# Patient Record
Sex: Male | Born: 1980 | Race: White | Hispanic: No | Smoking: Current some day smoker
Health system: Southern US, Community
[De-identification: ages and names within clinical notes are randomized; demographics above are authoritative.]

## PROBLEM LIST (undated history)

## (undated) DIAGNOSIS — R4589 Other symptoms and signs involving emotional state: Secondary | ICD-10-CM

## (undated) DIAGNOSIS — R400 Somnolence: Secondary | ICD-10-CM

## (undated) DIAGNOSIS — R002 Palpitations: Secondary | ICD-10-CM

## (undated) DIAGNOSIS — F419 Anxiety disorder, unspecified: Secondary | ICD-10-CM

## (undated) DIAGNOSIS — K760 Fatty (change of) liver, not elsewhere classified: Secondary | ICD-10-CM

## (undated) DIAGNOSIS — K829 Disease of gallbladder, unspecified: Secondary | ICD-10-CM

## (undated) DIAGNOSIS — R079 Chest pain, unspecified: Secondary | ICD-10-CM

## (undated) DIAGNOSIS — I10 Essential (primary) hypertension: Secondary | ICD-10-CM

## (undated) DIAGNOSIS — K219 Gastro-esophageal reflux disease without esophagitis: Secondary | ICD-10-CM

## (undated) DIAGNOSIS — M109 Gout, unspecified: Secondary | ICD-10-CM

## (undated) DIAGNOSIS — F329 Major depressive disorder, single episode, unspecified: Secondary | ICD-10-CM

## (undated) DIAGNOSIS — R0602 Shortness of breath: Secondary | ICD-10-CM

## (undated) DIAGNOSIS — G4730 Sleep apnea, unspecified: Secondary | ICD-10-CM

## (undated) DIAGNOSIS — F32A Depression, unspecified: Secondary | ICD-10-CM

## (undated) DIAGNOSIS — G932 Benign intracranial hypertension: Secondary | ICD-10-CM

## (undated) HISTORY — DX: Major depressive disorder, single episode, unspecified: F32.9

## (undated) HISTORY — DX: Depression, unspecified: F32.A

## (undated) HISTORY — DX: Gout, unspecified: M10.9

## (undated) HISTORY — DX: Somnolence: R40.0

## (undated) HISTORY — PX: NO PAST SURGERIES: SHX2092

## (undated) HISTORY — DX: Sleep apnea, unspecified: G47.30

## (undated) HISTORY — DX: Essential (primary) hypertension: I10

## (undated) HISTORY — DX: Palpitations: R00.2

## (undated) HISTORY — DX: Chest pain, unspecified: R07.9

## (undated) HISTORY — DX: Anxiety disorder, unspecified: F41.9

## (undated) HISTORY — DX: Other symptoms and signs involving emotional state: R45.89

## (undated) HISTORY — DX: Fatty (change of) liver, not elsewhere classified: K76.0

## (undated) HISTORY — DX: Shortness of breath: R06.02

## (undated) HISTORY — DX: Benign intracranial hypertension: G93.2

## (undated) HISTORY — DX: Disease of gallbladder, unspecified: K82.9

## (undated) HISTORY — DX: Gastro-esophageal reflux disease without esophagitis: K21.9

---

## 2015-04-17 ENCOUNTER — Ambulatory Visit (INDEPENDENT_AMBULATORY_CARE_PROVIDER_SITE_OTHER): Payer: Managed Care, Other (non HMO) | Admitting: Physician Assistant

## 2015-04-17 VITALS — BP 120/84 | HR 75 | Temp 99.0°F | Resp 18 | Ht 69.75 in | Wt 265.0 lb

## 2015-04-17 DIAGNOSIS — J40 Bronchitis, not specified as acute or chronic: Secondary | ICD-10-CM

## 2015-04-17 MED ORDER — IBUPROFEN 600 MG PO TABS: 600.0000 mg | ORAL_TABLET | Freq: Three times a day (TID) | ORAL | Status: DC | PRN

## 2015-04-17 MED ORDER — BENZONATATE 100 MG PO CAPS: 100.0000 mg | ORAL_CAPSULE | Freq: Three times a day (TID) | ORAL | Status: DC | PRN

## 2015-04-17 MED ORDER — HYDROCODONE-HOMATROPINE 5-1.5 MG/5ML PO SYRP
5.0000 mL | ORAL_SOLUTION | Freq: Every day | ORAL | Status: DC
Start: 2015-04-17 — End: 2016-04-24

## 2015-04-17 NOTE — Progress Notes (Signed)
04/18/2015 at 10:46 AM  Ronald George / DOB: 1981-11-18 / MRN: [id number]  The patient  does not have a problem list on file.  SUBJECTIVE  Chief complaint: Sinus Drainage; Cough; and Cough Congestion   Ronald George is a 34 y.o. well appearing male  complaining of URI symptoms that started 10 days ago which has since resolved.  Associated symptoms included congestion, fever, cough and sore throat, and he did note have difficulty breathing, headache and jaw pain.  He complains of lingering non bloody productive cough. He continues to deny SOB and does not report DOE or chest pain. The patient symptoms show no change thus far. Treatments tried thus far include acetaminophen, cough suppressant of choice with fair  relief. He reports sick contacts.   He  has a past medical history of Depression.    Medications reviewed and updated by myself where necessary, and exist elsewhere in the encounter.   Ronald George is allergic to penicillins. He  reports that he has been smoking.  He does not have any smokeless tobacco history on file. He reports that he does not drink alcohol or use illicit drugs. He  has no sexual activity history on file. The patient  has no past surgical history on file.  His family history includes Hypertension in his father and mother.  Review of Systems  Constitutional: Negative for fever and chills.  Eyes: Negative.   Respiratory: Negative for wheezing.   Cardiovascular: Negative for PND.  Gastrointestinal: Negative for heartburn, nausea, vomiting and abdominal pain.  Genitourinary: Negative.   Musculoskeletal: Negative for myalgias.  Skin: Negative for itching and rash.  Neurological: Negative for dizziness and headaches.  Psychiatric/Behavioral: Negative for depression.    OBJECTIVE  His  height is 5' 9.75" (1.772 m) and weight is 265 lb (120.203 kg). His oral temperature is 99 F (37.2 C). His blood pressure is 120/84 and his pulse is 75. His respiration is 18  and oxygen saturation is 99%.  The patient's body mass index is 38.28 kg/(m^2).  Physical Exam  Vitals reviewed. Constitutional: He is oriented to person, place, and time. He appears well-developed and well-nourished. No distress.  HENT:  Right Ear: Hearing, tympanic membrane, external ear and ear canal normal.  Left Ear: Hearing, tympanic membrane, external ear and ear canal normal.  Nose: No mucosal edema or sinus tenderness. Right sinus exhibits no maxillary sinus tenderness and no frontal sinus tenderness. Left sinus exhibits no maxillary sinus tenderness and no frontal sinus tenderness.  Mouth/Throat: Uvula is midline and mucous membranes are normal. Mucous membranes are not pale, not dry and not cyanotic. No oropharyngeal exudate, posterior oropharyngeal edema, posterior oropharyngeal erythema or tonsillar abscesses.  Cardiovascular: Normal rate and regular rhythm.   Respiratory: Effort normal and breath sounds normal. He has no wheezes. He has no rales.  Musculoskeletal: Normal range of motion.  Lymphadenopathy:    He has no cervical adenopathy.  Neurological: He is alert and oriented to person, place, and time.  Skin: Skin is warm and dry. He is not diaphoretic.  Psychiatric: He has a normal mood and affect.    No results found for this or any previous visit (from the past 24 hour(s)).  ASSESSMENT & PLAN  Ronald George was seen today for sinus drainage, cough and cough congestion.  Diagnoses and all orders for this visit:  Bronchitis: Vitals and PE reassuring.  He is well appearing.  Most likely viral sequelae.  Anticipatory guidance provided and will pursue symptomatic therapy for  the next week.  Advised that if he develops SOB, diaphoresis, DOE or nausea to RTC.  Advised that is he is not improved with the below plan to RTC.  Patient voiced understanding.   Orders: -     ibuprofen (ADVIL,MOTRIN) 600 MG tablet; Take 1 tablet (600 mg total) by mouth every 8 (eight) hours as needed. -      HYDROcodone-homatropine (HYCODAN) 5-1.5 MG/5ML syrup; Take 5 mLs by mouth at bedtime. -     benzonatate (TESSALON) 100 MG capsule; Take 1-2 capsules (100-200 mg total) by mouth 3 (three) times daily as needed for cough.   The patient was advised to call or come back to clinic if he does not see an improvement in symptoms, or worsens with the above plan.   Deliah Boston, MHS, PA-C [facility] 04/18/2015 10:46 AM

## 2016-04-24 ENCOUNTER — Encounter (HOSPITAL_COMMUNITY): Payer: Self-pay | Admitting: Emergency Medicine

## 2016-04-24 ENCOUNTER — Emergency Department (HOSPITAL_COMMUNITY): Payer: Managed Care, Other (non HMO)

## 2016-04-24 ENCOUNTER — Emergency Department (HOSPITAL_COMMUNITY)
Admission: EM | Admit: 2016-04-24 | Discharge: 2016-04-24 | Disposition: A | Discharge status: Home / Self Care | Payer: Managed Care, Other (non HMO) | Attending: Emergency Medicine | Admitting: Emergency Medicine

## 2016-04-24 DIAGNOSIS — K8020 Calculus of gallbladder without cholecystitis without obstruction: Secondary | ICD-10-CM | POA: Diagnosis not present

## 2016-04-24 DIAGNOSIS — F17200 Nicotine dependence, unspecified, uncomplicated: Secondary | ICD-10-CM | POA: Diagnosis not present

## 2016-04-24 DIAGNOSIS — F329 Major depressive disorder, single episode, unspecified: Secondary | ICD-10-CM | POA: Insufficient documentation

## 2016-04-24 DIAGNOSIS — R748 Abnormal levels of other serum enzymes: Secondary | ICD-10-CM | POA: Insufficient documentation

## 2016-04-24 DIAGNOSIS — Z79899 Other long term (current) drug therapy: Secondary | ICD-10-CM | POA: Insufficient documentation

## 2016-04-24 DIAGNOSIS — R1010 Upper abdominal pain, unspecified: Secondary | ICD-10-CM | POA: Diagnosis present

## 2016-04-24 LAB — COMPREHENSIVE METABOLIC PANEL
ALK PHOS: 66 U/L (ref 38–126)
ALT: 47 U/L (ref 17–63)
AST: 31 U/L (ref 15–41)
Albumin: 4.1 g/dL (ref 3.5–5.0)
Anion gap: 10 (ref 5–15)
BILIRUBIN TOTAL: 0.7 mg/dL (ref 0.3–1.2)
BUN: 12 mg/dL (ref 6–20)
CALCIUM: 9.2 mg/dL (ref 8.9–10.3)
CO2: 22 mmol/L (ref 22–32)
CREATININE: 1.14 mg/dL (ref 0.61–1.24)
Chloride: 109 mmol/L (ref 101–111)
Glucose, Bld: 93 mg/dL (ref 65–99)
Potassium: 3.8 mmol/L (ref 3.5–5.1)
Sodium: 141 mmol/L (ref 135–145)
TOTAL PROTEIN: 7.1 g/dL (ref 6.5–8.1)

## 2016-04-24 LAB — CBC
HCT: 43.9 % (ref 39.0–52.0)
Hemoglobin: 15.7 g/dL (ref 13.0–17.0)
MCH: 28.8 pg (ref 26.0–34.0)
MCHC: 35.8 g/dL (ref 30.0–36.0)
MCV: 80.6 fL (ref 78.0–100.0)
PLATELETS: 248 10*3/uL (ref 150–400)
RBC: 5.45 MIL/uL (ref 4.22–5.81)
RDW: 13.3 % (ref 11.5–15.5)
WBC: 8.7 10*3/uL (ref 4.0–10.5)

## 2016-04-24 LAB — LIPASE, BLOOD: LIPASE: 55 U/L — AB (ref 11–51)

## 2016-04-24 MED ORDER — ONDANSETRON 8 MG PO TBDP: 8.0000 mg | ORAL_TABLET | Freq: Three times a day (TID) | ORAL | Status: DC | PRN

## 2016-04-24 NOTE — Discharge Instructions (Signed)
Results here show that you have gallstones. Lipase is elevated, likely due to gall stones.  See the Surgeon a requested. Return to the Er if the pain is severe.   Cholelithiasis Cholelithiasis (also called gallstones) is a form of gallbladder disease in which gallstones form in your gallbladder. The gallbladder is an organ that stores bile made in the liver, which helps digest fats. Gallstones begin as small crystals and slowly grow into stones. Gallstone pain occurs when the gallbladder spasms and a gallstone is blocking the duct. Pain can also occur when a stone passes out of the duct.  RISK FACTORS  Being male.   Having multiple pregnancies. Health care providers sometimes advise removing diseased gallbladders before future pregnancies.   Being obese.  Eating a diet heavy in fried foods and fat.   Being older than 60 years and increasing age.   Prolonged use of medicines containing male hormones.   Having diabetes mellitus.   Rapidly losing weight.   Having a family history of gallstones (heredity).  SYMPTOMS  Nausea.   Vomiting.  Abdominal pain.   Yellowing of the skin (jaundice).   Sudden pain. It may persist from several minutes to several hours.  Fever.   Tenderness to the touch. In some cases, when gallstones do not move into the bile duct, people have no pain or symptoms. These are called "silent" gallstones.  TREATMENT Silent gallstones do not need treatment. In severe cases, emergency surgery may be required. Options for treatment include:  Surgery to remove the gallbladder. This is the most common treatment.  Medicines. These do not always work and may take 6-12 months or more to work.  Shock wave treatment (extracorporeal biliary lithotripsy). In this treatment an ultrasound machine sends shock waves to the gallbladder to break gallstones into smaller pieces that can pass into the intestines or be dissolved by medicine. HOME CARE  INSTRUCTIONS   Only take over-the-counter or prescription medicines for pain, discomfort, or fever as directed by your health care provider.   Follow a low-fat diet until seen again by your health care provider. Fat causes the gallbladder to contract, which can result in pain.   Follow up with your health care provider as directed. Attacks are almost always recurrent and surgery is usually required for permanent treatment.  SEEK IMMEDIATE MEDICAL CARE IF:   Your pain increases and is not controlled by medicines.   You have a fever or persistent symptoms for more than 2-3 days.   You have a fever and your symptoms suddenly get worse.   You have persistent nausea and vomiting.  MAKE SURE YOU:   Understand these instructions.  Will watch your condition.  Will get help right away if you are not doing well or get worse.   This information is not intended to replace advice given to you by your health care provider. Make sure you discuss any questions you have with your health care provider.   Document Released: 12/11/2005 Document Revised: 08/17/2013 Document Reviewed: 06/08/2013 Elsevier Interactive Patient Education 2016 Elsevier Inc. Clear Liquid Diet A clear liquid diet is a short-term diet that is prescribed to provide the necessary fluid and basic energy you need when you can have nothing else. The clear liquid diet consists of liquids or solids that will become liquid at room temperature. You should be able to see through the liquid. There are many reasons that you may be restricted to clear liquids, such as:  When you have a sudden-onset (acute) condition  that occurs before or after surgery.  To help your body slowly get adjusted to food again after a long period when you were unable to have food.  Replacement of fluids when you have a diarrheal disease.  When you are going to have certain exams, such as a colonoscopy, in which instruments are inserted inside your body  to look at parts of your digestive system. WHAT CAN I HAVE? A clear liquid diet does not provide all the nutrients you need. It is important to choose a variety of the following items to get as many nutrients as possible:  Vegetable juices that do not have pulp.  Fruit juices and fruit drinks that do not have pulp.  Coffee (regular or decaffeinated), tea, or soda at the discretion of your health care provider.  Clear bouillon, broth, or strained broth-based soups.  High-protein and flavored gelatins.  Sugar or honey.  Ices or frozen ice pops that do not contain milk. If you are not sure whether you can have certain items, you should ask your health care provider. You may also ask your health care provider if there are any other clear liquid options.   This information is not intended to replace advice given to you by your health care provider. Make sure you discuss any questions you have with your health care provider.   Document Released: 12/15/2005 Document Revised: 12/20/2013 Document Reviewed: 11/11/2013 Elsevier Interactive Patient Education [employer] Elsevier Inc.

## 2016-04-24 NOTE — ED Provider Notes (Signed)
CSN: [id number]     Arrival date & time 04/24/16  0251 History  By signing my name below, I, Bethel Born, attest that this documentation has been prepared under the direction and in the presence of Derwood Kaplan, MD. Electronically Signed: Bethel Born, ED Scribe. 04/24/2016. 5:27 AM   No chief complaint on file.  The history is provided by the patient. No language interpreter was used.   Ronald George is a 35 y.o. male who presents to the Emergency Department complaining of intermittent, sharp/cramping, 7/10 in severity, mid and upper abdominal pain with onset late yesterday afternoon while at work. He at a salad for lunch that did not have heavy cheese. The pain is exacerbated by deep breathing and radiates to the back. This pain is different than pain that he has with GERD and did not improve after an antacid. He ate a steak quesidilla from [employer] for dinner and notes that the pain became unbearable just PTA. Associated symptoms include nausea and 3 episodes of emesis yesterday. He is no longer nauseous. Pt denies diarrhea.  Pt denies heavy ibuprofen or alcohol use. He has no history of abdominal ulcers.  No history of abdominal surgery.   Past Medical History  Diagnosis Date  . Depression    History reviewed. No pertinent past surgical history. Family History  Problem Relation Age of Onset  . Hypertension Mother   . Hypertension Father    Social History  Substance Use Topics  . Smoking status: Light Tobacco Smoker  . Smokeless tobacco: None  . Alcohol Use: No    Review of Systems  10 Systems reviewed and all are negative for acute change except as noted in the HPI.  Allergies  Penicillins  Home Medications   Prior to Admission medications   Medication Sig Start Date End Date Taking? Authorizing Provider  cholecalciferol (VITAMIN D) 1000 units tablet Take 1,000 Units by mouth daily.   Yes Historical Provider, MD  LORazepam (ATIVAN) 0.5 MG tablet Take 0.5 mg by  mouth daily. 04/10/16  Yes Historical Provider, MD  Thiamine HCl (VITAMIN B-1) 250 MG tablet Take 250 mg by mouth daily.   Yes Historical Provider, MD  ondansetron (ZOFRAN ODT) 8 MG disintegrating tablet Take 1 tablet (8 mg total) by mouth every 8 (eight) hours as needed for nausea. 04/24/16   Yen Wandell, MD   BP 144/87 mmHg  Pulse 77  Temp(Src) 98.7 F (37.1 C) (Oral)  Resp 18  SpO2 96% Physical Exam  Constitutional: He is oriented to person, place, and time. He appears well-developed and well-nourished.  HENT:  Head: Normocephalic and atraumatic.  Eyes: EOM are normal.  Neck: Normal range of motion.  Cardiovascular: Normal rate, regular rhythm, normal heart sounds and intact distal pulses.   Pulmonary/Chest: Effort normal and breath sounds normal. No respiratory distress.  Abdominal: Soft. Bowel sounds are normal. He exhibits no distension. There is tenderness. There is no rebound and no guarding.  Generalized abdominal tenderness that is worse in the RUQ  Musculoskeletal: Normal range of motion.  Neurological: He is alert and oriented to person, place, and time.  Skin: Skin is warm and dry.  Psychiatric: He has a normal mood and affect. Judgment normal.  Nursing note and vitals reviewed.   ED Course  Procedures (including critical care time) DIAGNOSTIC STUDIES: Oxygen Saturation is 96% on RA,  normal by my interpretation.    COORDINATION OF CARE: 3:37 AM Discussed treatment plan which includes lab work and abdominal US with  pt at bedside and pt agreed to plan. Pt declines pain medication at the time of initial assessment.   Labs Review Labs Reviewed  LIPASE, BLOOD - Abnormal; Notable for the following:    Lipase 55 (*)    All other components within normal limits  COMPREHENSIVE METABOLIC PANEL  CBC  URINALYSIS, ROUTINE W REFLEX MICROSCOPIC (NOT AT [facility])    Imaging Review [place] Abdomen Limited  04/24/2016  CLINICAL DATA:  Right upper quadrant pain for 1 day EXAM: US  ABDOMEN LIMITED - RIGHT UPPER QUADRANT COMPARISON:  12/26/2015 FINDINGS: Gallbladder: Layering calculi.  No wall thickening or focal tenderness. Common bile duct: Limited visualization.  4 mm diameter where seen. Liver: Markedly echogenic with poor acoustic transmission, obscuring the mid and deep liver. IMPRESSION: 1. Cholelithiasis without cholecystitis. 2. Markedly steatotic liver, limiting sonographic assessment. Electronically Signed   By: Marnee SpringJonathon  Watts M.D.   On: 04/24/2016 04:36   I personally reviewed and evaluated these images and lab results as a part of my medical decision-making.   EKG Interpretation None      MDM   Final diagnoses:  Elevated lipase  Calculus of gallbladder without cholecystitis without obstruction    I personally performed the services described in this documentation, which was scribed in my presence. The recorded information has been reviewed and is accurate.  Pt comes in with abd pain. Vague discomfort, but clinical suspicion high for cholelithiasis vs. PUD. US ordered, shows gall stones. Pt had severe pain prior to ER arrival with emesis, but the symptoms are better now. He has lipase elevation - likely from gall stones. Pain is tolerable - will d/c. Strict ER return precautions have been discussed, and patient is agreeing with the plan and is comfortable with the workup done and the recommendations from the ER.   Derwood KaplanAnkit Jian Hodgman, MD 04/24/16 [phone]

## 2016-04-24 NOTE — ED Notes (Signed)
Patient presents for mid upper abdominal pain x1 day, described as cramping, two episodes of emesis. Last BM yesterday, normal for patient. Denies recent cough, fever, lightheadedness, dizziness, Rates pain 7/10.

## 2016-05-06 ENCOUNTER — Other Ambulatory Visit: Payer: Self-pay | Admitting: Surgery

## 2016-06-10 ENCOUNTER — Other Ambulatory Visit: Payer: Self-pay | Admitting: Surgery

## 2016-06-23 ENCOUNTER — Other Ambulatory Visit (HOSPITAL_COMMUNITY): Payer: Managed Care, Other (non HMO)

## 2016-06-24 ENCOUNTER — Encounter (HOSPITAL_COMMUNITY): Payer: Self-pay

## 2016-06-24 ENCOUNTER — Encounter (HOSPITAL_COMMUNITY)
Admission: RE | Admit: 2016-06-24 | Discharge: 2016-06-24 | Disposition: A | Discharge status: Home / Self Care | Payer: Managed Care, Other (non HMO) | Source: Ambulatory Visit | Attending: Surgery | Admitting: Surgery

## 2016-06-24 DIAGNOSIS — Z01812 Encounter for preprocedural laboratory examination: Secondary | ICD-10-CM | POA: Diagnosis present

## 2016-06-24 DIAGNOSIS — K8020 Calculus of gallbladder without cholecystitis without obstruction: Secondary | ICD-10-CM | POA: Diagnosis not present

## 2016-06-24 LAB — CBC
HEMATOCRIT: 44.1 % (ref 39.0–52.0)
Hemoglobin: 15 g/dL (ref 13.0–17.0)
MCH: 28.3 pg (ref 26.0–34.0)
MCHC: 34 g/dL (ref 30.0–36.0)
MCV: 83.2 fL (ref 78.0–100.0)
PLATELETS: 234 10*3/uL (ref 150–400)
RBC: 5.3 MIL/uL (ref 4.22–5.81)
RDW: 13.2 % (ref 11.5–15.5)
WBC: 6.8 10*3/uL (ref 4.0–10.5)

## 2016-06-24 NOTE — Pre-Procedure Instructions (Addendum)
Georgina QuintBrandon Goodine  06/24/2016      [facility] [id number] [facility], [place] - [address] [facility] [address] [place] [place] [id number] Phone: [phone] Fax: [phone]    Your procedure is scheduled on 06/30/16.  [facility].M[facility]  Call this number if you have problems the morning of surgery:  [phone]   Remember:  Do not eat food or drink liquids after midnight.  Take these medicines the morning of surgery with A SIP OF WATER omeprazole(prilosec), lorazepam(ativan) if needed  STOP all herbel meds, nsaids (aleve,naproxen,advil,ibuprofen) 5 days prior to surgery starting tomorrow including vitamins, aspirin   Do not wear jewelry, make-up or nail polish.  Do not wear lotions, powders, or perfumes.  You may wear deoderant.  Do not shave 48 hours prior to surgery.  Men may shave face and neck.  Do not bring valuables to the hospital.  [facility] is not responsible for any belongings or valuables.  Contacts, dentures or bridgework may not be worn into surgery.  Leave your suitcase in the car.  After surgery it may be brought to your room.  For patients admitted to the hospital, discharge time will be determined by your treatment team.  Patients discharged the day of surgery will not be allowed to drive home.   Name and phone number of your driver:    Special instructions:   Special Instructions: [place] - Preparing for Surgery  Before surgery, you can play an important role.  Because skin is not sterile, your skin needs to be as free of germs as possible.  You can reduce the number of germs on you skin by washing with CHG (chlorahexidine gluconate) soap before surgery.  CHG is an antiseptic cleaner which kills germs and bonds with the skin to continue killing germs even after washing.  Please DO NOT use if you have an allergy to CHG or antibacterial soaps.  If your skin becomes reddened/irritated  stop using the CHG and inform your nurse when you arrive at Short Stay.  Do not shave (including legs and underarms) for at least 48 hours prior to the first CHG shower.  You may shave your face.  Please follow these instructions carefully:   1.  Shower with CHG Soap the night before surgery and the morning of Surgery.  2.  If you choose to wash your hair, wash your hair first as usual with your normal shampoo.  3.  After you shampoo, rinse your hair and body thoroughly to remove the Shampoo.  4.  Use CHG as you would any other liquid soap.  You can apply chg directly  to the skin and wash gently with scrungie or a clean washcloth.  5.  Apply the CHG Soap to your body ONLY FROM THE NECK DOWN.  Do not use on open wounds or open sores.  Avoid contact with your eyes ears, mouth and genitals (private parts).  Wash genitals (private parts)       with your normal soap.  6.  Wash thoroughly, paying special attention to the area where your surgery will be performed.  7.  Thoroughly rinse your body with warm water from the neck down.  8.  DO NOT shower/wash with your normal soap after using and rinsing off the CHG Soap.  9.  Pat yourself dry with a clean towel.  10.  Wear clean pajamas.            11.  Place clean sheets on your bed the night of your first shower and do not sleep with pets.  Day of Surgery  Do not apply any lotions/deodorants the morning of surgery.  Please wear clean clothes to the hospital/surgery center.  Please read over the following fact sheets that you were given. Pain Booklet, Coughing and Deep Breathing and Surgical Site Infection Prevention

## 2016-06-27 MED ORDER — CIPROFLOXACIN IN D5W 400 MG/200ML IV SOLN
400.0000 mg | INTRAVENOUS | Status: AC
  Administered 2016-06-30: 400 mg via INTRAVENOUS
  Filled 2016-06-27: qty 200

## 2016-06-29 NOTE — H&P (Signed)
Ronald HiresBrandon M. George  Location: [facility] Patient #: [id number] DOB: Sep 27, 1981 Divorced / Language: [place] / Race: White Male   History of Present Illness  Patient words: New-gallbladder.  The patient is a 35 year old male who presents for evaluation of gall stones. This is a pleasant gentleman referred by the emergency department after a significant gallbladder attack. He presented there with the sudden onset of right upper quadrant epigastric abdominal pain with nausea and vomiting. The pain was sharp and severe. During the visit to the emergency department he improved. At that time he had an ultrasound showing cholelithiasis. There is no gallbladder wall thickening. The bile duct was normal. His liver function tests were normal but his lipase was elevated at 55. He has had no previous gallbladder attacks. He is otherwise healthy and without complaints. Bowel movements are within normal.   Other Problems Anxiety Disorder Gastroesophageal Reflux Disease  Past Surgical History  No pertinent past surgical history  Diagnostic Studies History  Colonoscopy never  Allergies  Penicillin G Sodium *PENICILLINS*  Medication History ( LORazepam (0.5MG  Tablet, Oral) Active. Omeprazole (20MG  Capsule DR, Oral) Active. Medications Reconciled  Social History  Caffeine use Carbonated beverages, Coffee, Tea. No alcohol use No drug use Tobacco use Current every day smoker.  Family History Arthritis Father, Mother. Breast Cancer Family Members In General. Cancer Family Members In General. Cerebrovascular Accident Family Members In General. Depression Mother. Hypertension Father, Mother.    Review of Systems ( General Present- Fatigue and Weight Gain. Not Present- Appetite Loss, Chills, Fever, Night Sweats and Weight Loss. Skin Not Present- Change in Wart/Mole, Dryness, Hives, Jaundice, New Lesions, Non-Healing Wounds, Rash and Ulcer. HEENT Not  Present- Earache, Hearing Loss, Hoarseness, Nose Bleed, Oral Ulcers, Ringing in the Ears, Seasonal Allergies, Sinus Pain, Sore Throat, Visual Disturbances, Wears glasses/contact lenses and Yellow Eyes. Respiratory Present- Difficulty Breathing. Not Present- Bloody sputum, Chronic Cough, Snoring and Wheezing. Breast Not Present- Breast Mass, Breast Pain, Nipple Discharge and Skin Changes. Cardiovascular Present- Shortness of Breath. Not Present- Chest Pain, Difficulty Breathing Lying Down, Leg Cramps, Palpitations, Rapid Heart Rate and Swelling of Extremities. Gastrointestinal Present- Abdominal Pain, Bloating, Excessive gas and Indigestion. Not Present- Bloody Stool, Change in Bowel Habits, Chronic diarrhea, Constipation, Difficulty Swallowing, Gets full quickly at meals, Hemorrhoids, Nausea, Rectal Pain and Vomiting. Male Genitourinary Not Present- Blood in Urine, Change in Urinary Stream, Frequency, Impotence, Nocturia, Painful Urination, Urgency and Urine Leakage. Musculoskeletal Not Present- Back Pain, Joint Pain, Joint Stiffness, Muscle Pain, Muscle Weakness and Swelling of Extremities. Neurological Not Present- Decreased Memory, Fainting, Headaches, Numbness, Seizures, Tingling, Tremor, Trouble walking and Weakness. Psychiatric Present- Anxiety and Change in Sleep Pattern. Not Present- Bipolar, Depression, Fearful and Frequent crying. Endocrine Not Present- Cold Intolerance, Excessive Hunger, Hair Changes, Heat Intolerance, Hot flashes and New Diabetes. Hematology Not Present- Easy Bruising, Excessive bleeding, Gland problems, HIV and Persistent Infections.  Vitals   Weight: 275.8 lb Height: 70in Body Surface Area: 2.39 m Body Mass Index: 39.57 kg/m  Temp.: 98.80F(Oral)  Pulse: 66 (Regular)  BP: 126/70 (Sitting, Left Arm, Standard)   Physical Exam  General Mental Status-Alert. General Appearance-Consistent with stated age. Hydration-Well  hydrated. Voice-Normal.  Head and Neck Head-normocephalic, atraumatic with no lesions or palpable masses.  Eye Eyeball - Bilateral-Extraocular movements intact. Sclera/Conjunctiva - Bilateral-No scleral icterus.  Chest and Lung Exam Chest and lung exam reveals -quiet, even and easy respiratory effort with no use of accessory muscles and on auscultation, normal breath sounds, no adventitious sounds  and normal vocal resonance. Inspection Chest Wall - Normal. Back - normal.  Cardiovascular Cardiovascular examination reveals -on palpation PMI is normal in location and amplitude, no palpable S3 or S4. Normal cardiac borders., normal heart sounds, regular rate and rhythm with no murmurs, carotid auscultation reveals no bruits and normal pedal pulses bilaterally.  Abdomen Inspection Inspection of the abdomen reveals - No Hernias. Skin - Scar - no surgical scars. Palpation/Percussion Palpation and Percussion of the abdomen reveal - Soft, Non Tender, No Rebound tenderness, No Rigidity (guarding) and No hepatosplenomegaly. Auscultation Auscultation of the abdomen reveals - Bowel sounds normal.  Neurologic - Did not examine.  Musculoskeletal Normal Exam - Left-Upper Extremity Strength Normal and Lower Extremity Strength Normal. Normal Exam - Right-Upper Extremity Strength Normal, Lower Extremity Weakness.    Assessment & Plan  SYMPTOMATIC CHOLELITHIASIS (K80.20)  Impression: I discussed the diagnosis with him in detail. I suspect that he had a temporary bile duct stone given the elevated lipase and the severity of his symptoms. Again, he is doing much better but still has some discomfort. Laparoscopic cholecystectomy with cholangiogram is recommended. I gave him literature regarding the surgery. I discussed the surgical procedure in detail. I discussed the risk of surgery which includes but is not limited to bleeding, infection, bile duct injury, bile leak, injury to other  structures, the need to convert to an open procedure, postoperative recovery, etc. He understands and wished to proceed with surgery. Current Plans Pt Education - Pamphlet Given - Laparoscopic Gallbladder Surgery: discussed with patient and provided information.

## 2016-06-30 ENCOUNTER — Ambulatory Visit (HOSPITAL_COMMUNITY)
Admission: RE | Admit: 2016-06-30 | Discharge: 2016-06-30 | Disposition: A | Discharge status: Home / Self Care | Payer: Managed Care, Other (non HMO) | Source: Ambulatory Visit | Attending: Surgery | Admitting: Surgery

## 2016-06-30 ENCOUNTER — Ambulatory Visit (HOSPITAL_COMMUNITY): Payer: Managed Care, Other (non HMO) | Admitting: Anesthesiology

## 2016-06-30 ENCOUNTER — Ambulatory Visit (HOSPITAL_COMMUNITY): Payer: Managed Care, Other (non HMO)

## 2016-06-30 ENCOUNTER — Encounter (HOSPITAL_COMMUNITY): Payer: Self-pay | Admitting: Urology

## 2016-06-30 ENCOUNTER — Encounter (HOSPITAL_COMMUNITY)
Admission: RE | Disposition: A | Discharge status: Home / Self Care | Payer: Self-pay | Source: Ambulatory Visit | Attending: Surgery

## 2016-06-30 DIAGNOSIS — K8010 Calculus of gallbladder with chronic cholecystitis without obstruction: Secondary | ICD-10-CM | POA: Insufficient documentation

## 2016-06-30 DIAGNOSIS — Z419 Encounter for procedure for purposes other than remedying health state, unspecified: Secondary | ICD-10-CM

## 2016-06-30 DIAGNOSIS — K219 Gastro-esophageal reflux disease without esophagitis: Secondary | ICD-10-CM | POA: Insufficient documentation

## 2016-06-30 HISTORY — PX: CHOLECYSTECTOMY: SHX55

## 2016-06-30 SURGERY — LAPAROSCOPIC CHOLECYSTECTOMY WITH INTRAOPERATIVE CHOLANGIOGRAM
Anesthesia: General

## 2016-06-30 MED ORDER — LIDOCAINE HCL (CARDIAC) 20 MG/ML IV SOLN
INTRAVENOUS | Status: DC | PRN
  Administered 2016-06-30: 60 mg via INTRAVENOUS

## 2016-06-30 MED ORDER — ONDANSETRON HCL 4 MG/2ML IJ SOLN
INTRAMUSCULAR | Status: AC
  Filled 2016-06-30: qty 2

## 2016-06-30 MED ORDER — MEPERIDINE HCL 25 MG/ML IJ SOLN: 6.2500 mg | INTRAMUSCULAR | Status: DC | PRN

## 2016-06-30 MED ORDER — DIPHENHYDRAMINE HCL 50 MG/ML IJ SOLN
INTRAMUSCULAR | Status: AC
  Filled 2016-06-30: qty 1

## 2016-06-30 MED ORDER — SODIUM CHLORIDE 0.9 % IV SOLN
INTRAVENOUS | Status: DC | PRN
  Administered 2016-06-30: 9 mL

## 2016-06-30 MED ORDER — ONDANSETRON HCL 4 MG/2ML IJ SOLN
INTRAMUSCULAR | Status: DC | PRN
  Administered 2016-06-30: 4 mg via INTRAVENOUS

## 2016-06-30 MED ORDER — LIDOCAINE 2% (20 MG/ML) 5 ML SYRINGE
INTRAMUSCULAR | Status: AC
  Filled 2016-06-30: qty 5

## 2016-06-30 MED ORDER — PROPOFOL 10 MG/ML IV BOLUS
INTRAVENOUS | Status: AC
  Filled 2016-06-30: qty 20

## 2016-06-30 MED ORDER — OXYCODONE HCL 5 MG PO TABS: 5.0000 mg | ORAL_TABLET | ORAL | Status: DC | PRN

## 2016-06-30 MED ORDER — SODIUM CHLORIDE 0.9 % IR SOLN
Status: DC | PRN
  Administered 2016-06-30: 1000 mL

## 2016-06-30 MED ORDER — LACTATED RINGERS IV SOLN: INTRAVENOUS | Status: DC

## 2016-06-30 MED ORDER — SODIUM CHLORIDE 0.9% FLUSH: 3.0000 mL | Freq: Two times a day (BID) | INTRAVENOUS | Status: DC

## 2016-06-30 MED ORDER — BUPIVACAINE-EPINEPHRINE (PF) 0.25% -1:200000 IJ SOLN
INTRAMUSCULAR | Status: AC
Start: 2016-06-30 — End: 2016-06-30
  Filled 2016-06-30: qty 30

## 2016-06-30 MED ORDER — SUGAMMADEX SODIUM 500 MG/5ML IV SOLN
INTRAVENOUS | Status: DC | PRN
  Administered 2016-06-30: 360 mg via INTRAVENOUS

## 2016-06-30 MED ORDER — ACETAMINOPHEN 650 MG RE SUPP: 650.0000 mg | RECTAL | Status: DC | PRN

## 2016-06-30 MED ORDER — 0.9 % SODIUM CHLORIDE (POUR BTL) OPTIME
TOPICAL | Status: DC | PRN
  Administered 2016-06-30: 1000 mL

## 2016-06-30 MED ORDER — LACTATED RINGERS IV SOLN
INTRAVENOUS | Status: DC | PRN
  Administered 2016-06-30 (×2): via INTRAVENOUS

## 2016-06-30 MED ORDER — MIDAZOLAM HCL 2 MG/2ML IJ SOLN
INTRAMUSCULAR | Status: AC
Start: 2016-06-30 — End: 2016-06-30
  Filled 2016-06-30: qty 2

## 2016-06-30 MED ORDER — SODIUM CHLORIDE 0.9% FLUSH
3.0000 mL | INTRAVENOUS | Status: DC | PRN
Start: 2016-06-30 — End: 2016-06-30

## 2016-06-30 MED ORDER — FENTANYL CITRATE (PF) 100 MCG/2ML IJ SOLN
INTRAMUSCULAR | Status: DC | PRN
  Administered 2016-06-30: 50 ug via INTRAVENOUS
  Administered 2016-06-30: 150 ug via INTRAVENOUS
  Administered 2016-06-30 (×3): 100 ug via INTRAVENOUS

## 2016-06-30 MED ORDER — PROPOFOL 10 MG/ML IV BOLUS
INTRAVENOUS | Status: DC | PRN
  Administered 2016-06-30: 200 mg via INTRAVENOUS

## 2016-06-30 MED ORDER — MORPHINE SULFATE (PF) 2 MG/ML IV SOLN
INTRAVENOUS | Status: AC
  Filled 2016-06-30: qty 1

## 2016-06-30 MED ORDER — PROMETHAZINE HCL 25 MG/ML IJ SOLN: 6.2500 mg | INTRAMUSCULAR | Status: DC | PRN

## 2016-06-30 MED ORDER — IOPAMIDOL (ISOVUE-300) INJECTION 61%
INTRAVENOUS | Status: AC
Start: 2016-06-30 — End: 2016-06-30
  Filled 2016-06-30: qty 50

## 2016-06-30 MED ORDER — FENTANYL CITRATE (PF) 250 MCG/5ML IJ SOLN
INTRAMUSCULAR | Status: AC
  Filled 2016-06-30: qty 5

## 2016-06-30 MED ORDER — FENTANYL CITRATE (PF) 250 MCG/5ML IJ SOLN
INTRAMUSCULAR | Status: AC
Start: 2016-06-30 — End: 2016-06-30
  Filled 2016-06-30: qty 5

## 2016-06-30 MED ORDER — KETOROLAC TROMETHAMINE 30 MG/ML IJ SOLN
INTRAMUSCULAR | Status: DC | PRN
  Administered 2016-06-30: 30 mg via INTRAVENOUS

## 2016-06-30 MED ORDER — SUCCINYLCHOLINE CHLORIDE 200 MG/10ML IV SOSY
PREFILLED_SYRINGE | INTRAVENOUS | Status: AC
  Filled 2016-06-30: qty 10

## 2016-06-30 MED ORDER — SUGAMMADEX SODIUM 500 MG/5ML IV SOLN
INTRAVENOUS | Status: AC
Start: 2016-06-30 — End: 2016-06-30
  Filled 2016-06-30: qty 5

## 2016-06-30 MED ORDER — MORPHINE SULFATE (PF) 2 MG/ML IV SOLN
1.0000 mg | INTRAVENOUS | Status: DC | PRN
  Administered 2016-06-30: 2 mg via INTRAVENOUS

## 2016-06-30 MED ORDER — ACETAMINOPHEN 325 MG PO TABS: 650.0000 mg | ORAL_TABLET | ORAL | Status: DC | PRN

## 2016-06-30 MED ORDER — BUPIVACAINE-EPINEPHRINE 0.25% -1:200000 IJ SOLN
INTRAMUSCULAR | Status: DC | PRN
  Administered 2016-06-30: 20 mL

## 2016-06-30 MED ORDER — ROCURONIUM BROMIDE 50 MG/5ML IV SOLN
INTRAVENOUS | Status: AC
  Filled 2016-06-30: qty 1

## 2016-06-30 MED ORDER — HYDROMORPHONE HCL 1 MG/ML IJ SOLN
INTRAMUSCULAR | Status: AC
  Filled 2016-06-30: qty 1

## 2016-06-30 MED ORDER — HYDROCODONE-ACETAMINOPHEN 5-325 MG PO TABS: 1.0000 | ORAL_TABLET | ORAL | Status: DC | PRN

## 2016-06-30 MED ORDER — MIDAZOLAM HCL 5 MG/5ML IJ SOLN
INTRAMUSCULAR | Status: DC | PRN
  Administered 2016-06-30: 2 mg via INTRAVENOUS

## 2016-06-30 MED ORDER — SODIUM CHLORIDE 0.9 % IV SOLN: 250.0000 mL | INTRAVENOUS | Status: DC | PRN

## 2016-06-30 MED ORDER — ROCURONIUM BROMIDE 100 MG/10ML IV SOLN
INTRAVENOUS | Status: DC | PRN
  Administered 2016-06-30: 50 mg via INTRAVENOUS

## 2016-06-30 MED ORDER — HYDROMORPHONE HCL 1 MG/ML IJ SOLN
0.2500 mg | INTRAMUSCULAR | Status: DC | PRN
  Administered 2016-06-30 (×2): 0.5 mg via INTRAVENOUS

## 2016-06-30 MED ORDER — DIPHENHYDRAMINE HCL 50 MG/ML IJ SOLN
INTRAMUSCULAR | Status: DC | PRN
  Administered 2016-06-30: 25 mg via INTRAVENOUS

## 2016-06-30 MED ORDER — SUCCINYLCHOLINE CHLORIDE 20 MG/ML IJ SOLN
INTRAMUSCULAR | Status: DC | PRN
  Administered 2016-06-30: 120 mg via INTRAVENOUS

## 2016-06-30 SURGICAL SUPPLY — 33 items
APPLIER CLIP 5 13 M/L LIGAMAX5 (MISCELLANEOUS) ×3
BLADE SURG CLIPPER 3M 9600 (MISCELLANEOUS) ×3 IMPLANT
CANISTER SUCTION 2500CC (MISCELLANEOUS) ×3 IMPLANT
CHLORAPREP W/TINT 26ML (MISCELLANEOUS) ×3 IMPLANT
CLIP APPLIE 5 13 M/L LIGAMAX5 (MISCELLANEOUS) ×1 IMPLANT
COVER MAYO STAND STRL (DRAPES) ×3 IMPLANT
COVER SURGICAL LIGHT HANDLE (MISCELLANEOUS) ×3 IMPLANT
DRAPE C-ARM 42X72 X-RAY (DRAPES) IMPLANT
ELECT REM PT RETURN 9FT ADLT (ELECTROSURGICAL) ×3
ELECTRODE REM PT RTRN 9FT ADLT (ELECTROSURGICAL) ×1 IMPLANT
GLOVE SURG SIGNA 7.5 PF LTX (GLOVE) ×3 IMPLANT
GOWN STRL REUS W/ TWL LRG LVL3 (GOWN DISPOSABLE) ×2 IMPLANT
GOWN STRL REUS W/ TWL XL LVL3 (GOWN DISPOSABLE) ×1 IMPLANT
GOWN STRL REUS W/TWL LRG LVL3 (GOWN DISPOSABLE) ×4
GOWN STRL REUS W/TWL XL LVL3 (GOWN DISPOSABLE) ×2
KIT BASIN OR (CUSTOM PROCEDURE TRAY) ×3 IMPLANT
KIT ROOM TURNOVER OR (KITS) ×3 IMPLANT
LIQUID BAND (GAUZE/BANDAGES/DRESSINGS) ×3 IMPLANT
NS IRRIG 1000ML POUR BTL (IV SOLUTION) ×3 IMPLANT
PAD ARMBOARD 7.5X6 YLW CONV (MISCELLANEOUS) ×3 IMPLANT
POUCH SPECIMEN RETRIEVAL 10MM (ENDOMECHANICALS) ×3 IMPLANT
SCISSORS LAP 5X35 DISP (ENDOMECHANICALS) ×3 IMPLANT
SET CHOLANGIOGRAPH 5 50 .035 (SET/KITS/TRAYS/PACK) IMPLANT
SET IRRIG TUBING LAPAROSCOPIC (IRRIGATION / IRRIGATOR) ×3 IMPLANT
SLEEVE ENDOPATH XCEL 5M (ENDOMECHANICALS) ×6 IMPLANT
SPECIMEN JAR SMALL (MISCELLANEOUS) ×3 IMPLANT
SUT MON AB 4-0 PC3 18 (SUTURE) ×3 IMPLANT
TOWEL OR 17X24 6PK STRL BLUE (TOWEL DISPOSABLE) ×3 IMPLANT
TOWEL OR 17X26 10 PK STRL BLUE (TOWEL DISPOSABLE) ×3 IMPLANT
TRAY LAPAROSCOPIC MC (CUSTOM PROCEDURE TRAY) ×3 IMPLANT
TROCAR XCEL BLUNT TIP 100MML (ENDOMECHANICALS) ×3 IMPLANT
TROCAR XCEL NON-BLD 5MMX100MML (ENDOMECHANICALS) ×3 IMPLANT
TUBING INSUFFLATION (TUBING) ×3 IMPLANT

## 2016-06-30 NOTE — Transfer of Care (Signed)
Immediate Anesthesia Transfer of Care Note  Patient: Ronald George  Procedure(s) Performed: Procedure(s): LAPAROSCOPIC CHOLECYSTECTOMY WITH INTRAOPERATIVE CHOLANGIOGRAM (N/A)  Patient Location: PACU  Anesthesia Type:General  Level of Consciousness: awake, alert , oriented and patient cooperative  Airway & Oxygen Therapy: Patient Spontanous Breathing and Patient connected to nasal cannula oxygen  Post-op Assessment: Report given to RN, Post -op Vital signs reviewed and stable and Patient moving all extremities  Post vital signs: Reviewed and stable  Last Vitals:  Filed Vitals:   06/30/16 0545 06/30/16 0845  BP: 155/84 134/72  Pulse: 73 80  Temp: 36.8 C 36.6 C  Resp: 20 10    Last Pain: There were no vitals filed for this visit.       Complications: No apparent anesthesia complications

## 2016-06-30 NOTE — Addendum Note (Signed)
Addendum  created 06/30/16 1240 by Andree ElkMichael L Lajuan Godbee, CRNA   Modules edited: Anesthesia Medication Administration

## 2016-06-30 NOTE — Interval H&P Note (Signed)
History and Physical Interval Note: no change in H and P  06/30/2016 6:43 AM  Ronald QuintBrandon Mckinnie  has presented today for surgery, with the diagnosis of gallstones  The various methods of treatment have been discussed with the patient and family. After consideration of risks, benefits and other options for treatment, the patient has consented to  Procedure(s): LAPAROSCOPIC CHOLECYSTECTOMY WITH INTRAOPERATIVE CHOLANGIOGRAM (N/A) as a surgical intervention .  The patient's history has been reviewed, patient examined, no change in status, stable for surgery.  I have reviewed the patient's chart and labs.  Questions were answered to the patient's satisfaction.     Eddy Liszewski A

## 2016-06-30 NOTE — Anesthesia Procedure Notes (Signed)
Procedure Name: Intubation Date/Time: 06/30/2016 7:48 AM Performed by: Ferol LuzMCMILLEN, Emylee Decelle L Pre-anesthesia Checklist: Patient identified, Emergency Drugs available, Suction available and Patient being monitored Patient Re-evaluated:Patient Re-evaluated prior to inductionOxygen Delivery Method: Circle System Utilized Preoxygenation: Pre-oxygenation with 100% oxygen Intubation Type: IV induction Ventilation: Mask ventilation with difficulty Laryngoscope Size: Mac and 4 Grade View: Grade II Tube type: Oral Tube size: 8.0 mm Number of attempts: 1 Airway Equipment and Method: Stylet and Oral airway Placement Confirmation: ETT inserted through vocal cords under direct vision,  positive ETCO2 and breath sounds checked- equal and bilateral Secured at: 22 cm Tube secured with: Tape Dental Injury: Teeth and Oropharynx as per pre-operative assessment

## 2016-06-30 NOTE — Anesthesia Postprocedure Evaluation (Signed)
Anesthesia Post Note  Patient: Ronald George  Procedure(s) Performed: Procedure(s) (LRB): LAPAROSCOPIC CHOLECYSTECTOMY WITH INTRAOPERATIVE CHOLANGIOGRAM (N/A)  Patient location during evaluation: PACU Anesthesia Type: General Level of consciousness: awake and alert Pain management: pain level controlled Vital Signs Assessment: post-procedure vital signs reviewed and stable Respiratory status: spontaneous breathing, nonlabored ventilation, respiratory function stable and patient connected to nasal cannula oxygen Cardiovascular status: blood pressure returned to baseline and stable Postop Assessment: no signs of nausea or vomiting Anesthetic complications: no    Last Vitals:  Filed Vitals:   06/30/16 1015 06/30/16 1030  BP: 131/91   Pulse: 68 57  Temp:    Resp: 8 13    Last Pain:  Filed Vitals:   06/30/16 1031  PainSc: Asleep                 Shelton SilvasKevin D Deyona Soza

## 2016-06-30 NOTE — Anesthesia Preprocedure Evaluation (Addendum)
Anesthesia Evaluation  Patient identified by MRN, date of birth, ID band Patient awake    Reviewed: Allergy & Precautions, NPO status , Patient's Chart, lab work & pertinent test results  Airway Mallampati: II  TM Distance: >3 FB Neck ROM: Full    Dental  (+) Teeth Intact, Dental Advisory Given   Pulmonary Current Smoker,    breath sounds clear to auscultation       Cardiovascular negative cardio ROS   Rhythm:Regular Rate:Normal     Neuro/Psych negative neurological ROS  negative psych ROS   GI/Hepatic negative GI ROS, Neg liver ROS,   Endo/Other  negative endocrine ROS  Renal/GU negative Renal ROS  negative genitourinary   Musculoskeletal negative musculoskeletal ROS (+)   Abdominal   Peds negative pediatric ROS (+)  Hematology negative hematology ROS (+)   Anesthesia Other Findings   Reproductive/Obstetrics negative OB ROS                            Lab Results  Component Value Date   WBC 6.8 06/24/2016   HGB 15.0 06/24/2016   HCT 44.1 06/24/2016   MCV 83.2 06/24/2016   PLT 234 06/24/2016   Lab Results  Component Value Date   CREATININE 1.14 04/24/2016   BUN 12 04/24/2016   NA 141 04/24/2016   K 3.8 04/24/2016   CL 109 04/24/2016   CO2 22 04/24/2016   No results found for: INR, PROTIME    Anesthesia Physical Anesthesia Plan  ASA: III  Anesthesia Plan: General   Post-op Pain Management:    Induction: Intravenous  Airway Management Planned: Oral ETT  Additional Equipment:   Intra-op Plan:   Post-operative Plan: Extubation in OR  Informed Consent: I have reviewed the patients History and Physical, chart, labs and discussed the procedure including the risks, benefits and alternatives for the proposed anesthesia with the patient or authorized representative who has indicated his/her understanding and acceptance.   Dental advisory given  Plan Discussed with:  CRNA  Anesthesia Plan Comments:         Anesthesia Quick Evaluation

## 2016-06-30 NOTE — Discharge Instructions (Signed)
[facility] ______[facility] °LAPAROSCOPIC SURGERY: POST OP INSTRUCTIONS °Always review your discharge instruction sheet given to you by the facility where your surgery was performed. °IF YOU HAVE DISABILITY OR FAMILY LEAVE FORMS, YOU MUST BRING THEM TO THE OFFICE FOR PROCESSING.   °DO NOT GIVE THEM TO YOUR DOCTOR. ° °1. A prescription for pain medication may be given to you upon discharge.  Take your pain medication as prescribed, if needed.  If narcotic pain medicine is not needed, then you may take acetaminophen (Tylenol) or ibuprofen (Advil) as needed. °2. Take your usually prescribed medications unless otherwise directed. °3. If you need a refill on your pain medication, please contact your pharmacy.  They will contact our office to request authorization. Prescriptions will not be filled after 5pm or on week-ends. °4. You should follow a light diet the first few days after arrival home, such as soup and crackers, etc.  Be sure to include lots of fluids daily. °5. Most patients will experience some swelling and bruising in the area of the incisions.  Ice packs will help.  Swelling and bruising can take several days to resolve.  °6. It is common to experience some constipation if taking pain medication after surgery.  Increasing fluid intake and taking a stool softener (such as Colace) will usually help or prevent this problem from occurring.  A mild laxative (Milk of Magnesia or Miralax) should be taken according to package instructions if there are no bowel movements after 48 hours. °7. Unless discharge instructions indicate otherwise, you may remove your bandages 24-48 hours after surgery, and you may shower at that time.  You may have steri-strips (small skin tapes) in place directly over the incision.  These strips should be left on the skin for 7-10 days.  If your surgeon used skin glue on the incision, you may shower in 24 hours.  The glue will flake off over the next 2-3 weeks.  Any sutures or  staples will be removed at the office during your follow-up visit. °8. ACTIVITIES:  You may resume regular (light) daily activities beginning the next day--such as daily self-care, walking, climbing stairs--gradually increasing activities as tolerated.  You may have sexual intercourse when it is comfortable.  Refrain from any heavy lifting or straining until approved by your doctor. °a. You may drive when you are no longer taking prescription pain medication, you can comfortably wear a seatbelt, and you can safely maneuver your car and apply brakes. °b. RETURN TO WORK:  __________________________________________________________ °9. You should see your doctor in the office for a follow-up appointment approximately 2-3 weeks after your surgery.  Make sure that you call for this appointment within a day or two after you arrive home to insure a convenient appointment time. °10. OTHER INSTRUCTIONS: NO LIFTING MORE THAN 15 POUNDS FOR 2 WEEKS __________________________________________________________________________________________________________________________ __________________________________________________________________________________________________________________________ °WHEN TO CALL YOUR DOCTOR: °1. Fever over 101.0 °2. Inability to urinate °3. Continued bleeding from incision. °4. Increased pain, redness, or drainage from the incision. °5. Increasing abdominal pain ° °The clinic staff is available to answer your questions during regular business hours.  Please don’t hesitate to call and ask to speak to one of the nurses for clinical concerns.  If you have a medical emergency, go to the nearest emergency room or call 911.  A surgeon from [facility] is always on call at the hospital. °[address], [address], [place], [place]  [address] ? [address] [address], [place], [place]   [address] °[phone]   [phone] ? [phone] ? FAX [phone] °Web site: [email or web address] °

## 2016-06-30 NOTE — Op Note (Signed)
Laparoscopic Cholecystectomy with IOC Procedure Note  Indications: This patient presents with symptomatic gallbladder disease and will undergo laparoscopic cholecystectomy.  Pre-operative Diagnosis: symptomatic cholelithiasis  Post-operative Diagnosis: Same  Surgeon: Abigail MiyamotoBLACKMAN,Mazie Fencl A   Assistants: 0  Anesthesia: General endotracheal anesthesia  ASA Class: 2  Procedure Details  The patient was seen again in the Holding Room. The risks, benefits, complications, treatment options, and expected outcomes were discussed with the patient. The possibilities of reaction to medication, pulmonary aspiration, perforation of viscus, bleeding, recurrent infection, finding a normal gallbladder, the need for additional procedures, failure to diagnose a condition, the possible need to convert to an open procedure, and creating a complication requiring transfusion or operation were discussed with the patient. The likelihood of improving the patient's symptoms with return to their baseline status is good.  The patient and/or family concurred with the proposed plan, giving informed consent. The site of surgery properly noted. The patient was taken to Operating Room, identified as Ronald George and the procedure verified as Laparoscopic Cholecystectomy with Intraoperative Cholangiogram. A Time Out was held and the above information confirmed.  Prior to the induction of general anesthesia, antibiotic prophylaxis was administered. General endotracheal anesthesia was then administered and tolerated well. After the induction, the abdomen was prepped with Chloraprep and draped in the sterile fashion. The patient was positioned in the supine position.  Local anesthetic agent was injected into the skin near the umbilicus and an incision made. We dissected down to the abdominal fascia with blunt dissection.  The fascia was incised vertically and we entered the peritoneal cavity bluntly.  A pursestring suture of 0-Vicryl  was placed around the fascial opening.  The Hasson cannula was inserted and secured with the stay suture.  Pneumoperitoneum was then created with CO2 and tolerated well without any adverse changes in the patient's vital signs. A 5-mm port was placed in the subxiphoid position.  Two 5-mm ports were placed in the right upper quadrant. All skin incisions were infiltrated with a local anesthetic agent before making the incision and placing the trocars.   We positioned the patient in reverse Trendelenburg, tilted slightly to the patient's left.  The gallbladder was identified, the fundus grasped and retracted cephalad. Adhesions were lysed bluntly and with the electrocautery where indicated, taking care not to injure any adjacent organs or viscus. The infundibulum was grasped and retracted laterally, exposing the peritoneum overlying the triangle of Calot. This was then divided and exposed in a blunt fashion. A critical view of the cystic duct and cystic artery was obtained.  The cystic duct was clearly identified and bluntly dissected circumferentially. The cystic duct was ligated with a clip distally.   An incision was made in the cystic duct and the [facility] catheter introduced. The catheter was secured using a clip. A cholangiogram was then obtained which showed good visualization of the distal and proximal biliary tree with no sign of filling defects or obstruction.  Contrast flowed easily into the duodenum. The catheter was then removed.   The cystic duct was then ligated with clips and divided. The cystic artery was identified, dissected free, ligated with clips and divided as well.   The gallbladder was dissected from the liver bed in retrograde fashion with the electrocautery. The gallbladder was removed and placed in an Endocatch sac. The liver bed was irrigated and inspected. Hemostasis was achieved with the electrocautery. Copious irrigation was utilized and was repeatedly aspirated until  clear.  The gallbladder and Endocatch sac were then removed  through the umbilical port site.  The pursestring suture was used to close the umbilical fascia.    We again inspected the right upper quadrant for hemostasis.  Pneumoperitoneum was released as we removed the trocars.  4-0 Monocryl was used to close the skin.   Skin glue was then applied. The patient was then extubated and brought to the recovery room in stable condition. Instrument, sponge, and needle counts were correct at closure and at the conclusion of the case.   Findings: Chronic Cholecystitis with Cholelithiasis  Estimated Blood Loss: Minimal         Drains: 0         Specimens: Gallbladder           Complications: None; patient tolerated the procedure well.         Disposition: PACU - hemodynamically stable.         Condition: stable

## 2016-07-02 ENCOUNTER — Encounter (HOSPITAL_COMMUNITY): Payer: Self-pay | Admitting: Surgery

## 2018-02-17 ENCOUNTER — Emergency Department (HOSPITAL_COMMUNITY): Payer: 59

## 2018-02-17 ENCOUNTER — Encounter (HOSPITAL_COMMUNITY): Payer: Self-pay | Admitting: Emergency Medicine

## 2018-02-17 ENCOUNTER — Emergency Department (HOSPITAL_COMMUNITY)
Admission: EM | Admit: 2018-02-17 | Discharge: 2018-02-17 | Disposition: A | Discharge status: Home / Self Care | Payer: 59 | Attending: Emergency Medicine | Admitting: Emergency Medicine

## 2018-02-17 DIAGNOSIS — F17210 Nicotine dependence, cigarettes, uncomplicated: Secondary | ICD-10-CM | POA: Diagnosis not present

## 2018-02-17 DIAGNOSIS — R42 Dizziness and giddiness: Secondary | ICD-10-CM | POA: Insufficient documentation

## 2018-02-17 DIAGNOSIS — R202 Paresthesia of skin: Secondary | ICD-10-CM | POA: Diagnosis not present

## 2018-02-17 DIAGNOSIS — R110 Nausea: Secondary | ICD-10-CM | POA: Diagnosis not present

## 2018-02-17 DIAGNOSIS — Z79899 Other long term (current) drug therapy: Secondary | ICD-10-CM | POA: Diagnosis not present

## 2018-02-17 DIAGNOSIS — R200 Anesthesia of skin: Secondary | ICD-10-CM | POA: Insufficient documentation

## 2018-02-17 LAB — CBC
HEMATOCRIT: 43.5 % (ref 39.0–52.0)
HEMOGLOBIN: 15.4 g/dL (ref 13.0–17.0)
MCH: 29.1 pg (ref 26.0–34.0)
MCHC: 35.4 g/dL (ref 30.0–36.0)
MCV: 82.2 fL (ref 78.0–100.0)
Platelets: 242 10*3/uL (ref 150–400)
RBC: 5.29 MIL/uL (ref 4.22–5.81)
RDW: 13.4 % (ref 11.5–15.5)
WBC: 6.9 10*3/uL (ref 4.0–10.5)

## 2018-02-17 LAB — URINALYSIS, ROUTINE W REFLEX MICROSCOPIC
BILIRUBIN URINE: NEGATIVE
GLUCOSE, UA: NEGATIVE mg/dL
HGB URINE DIPSTICK: NEGATIVE
Ketones, ur: NEGATIVE mg/dL
Leukocytes, UA: NEGATIVE
Nitrite: NEGATIVE
PH: 6 (ref 5.0–8.0)
Protein, ur: NEGATIVE mg/dL
SPECIFIC GRAVITY, URINE: 1.014 (ref 1.005–1.030)

## 2018-02-17 LAB — BASIC METABOLIC PANEL
ANION GAP: 10 (ref 5–15)
BUN: 12 mg/dL (ref 6–20)
CO2: 21 mmol/L — AB (ref 22–32)
Calcium: 9.1 mg/dL (ref 8.9–10.3)
Chloride: 108 mmol/L (ref 101–111)
Creatinine, Ser: 1.13 mg/dL (ref 0.61–1.24)
GFR calc Af Amer: >60 mL/min (ref >60)
GFR calc non Af Amer: >60 mL/min (ref >60)
GLUCOSE: 111 mg/dL — AB (ref 65–99)
POTASSIUM: 3.9 mmol/L (ref 3.5–5.1)
Sodium: 139 mmol/L (ref 135–145)

## 2018-02-17 LAB — CBG MONITORING, ED: Glucose-Capillary: 96 mg/dL (ref 65–99)

## 2018-02-17 NOTE — ED Triage Notes (Signed)
Patient here from home with complaints of intermittent dizziness, numbness and tingling to tongue and face, nausea, and confusion. Denies chest pain. Reports communicating symptoms with PCP who prescribed medications with no relief. Reports cardiology appt. Coming up for irregular heart beat.

## 2018-02-17 NOTE — ED Provider Notes (Signed)
[facility]-EMERGENCY DEPT Provider Note   CSN: [id number] Arrival date & time: 02/17/18  0905     History   Chief Complaint Chief Complaint  Patient presents with  . Dizziness  . Altered Mental Status    HPI Ronald George is a 37 y.o. male.  HPI Patient presents episodes of dizziness numbness and tingling to his face and head.  States that he had some nausea and may have confusion with the episodes.  Will come and go.  States after an episode he will go to sleep and then wake up feeling worn out.  States he has been having this for a month now.  Saw his primary care doctor and was started on propranolol for palpitations.  States he would feel his heart race at times.  This was not necessarily associated with the numbness symptoms.  He states palpitations have improved but the numbness episodes do not.  Does not have chest pain with the episode.  States he does get some blurred vision in the left eye with the episodes.  States he does have some dots in the eye when it happens. History reviewed. No pertinent past medical history.  There are no active problems to display for this patient.   Past Surgical History:  Procedure Laterality Date  . CHOLECYSTECTOMY N/A 06/30/2016   Procedure: LAPAROSCOPIC CHOLECYSTECTOMY WITH INTRAOPERATIVE CHOLANGIOGRAM;  Surgeon: Abigail Miyamotoouglas Blackman, MD;  Location: [facility] OR;  Service: General;  Laterality: N/A;  . NO PAST SURGERIES     no anesthesia ever       Home Medications    Prior to Admission medications   Medication Sig Start Date End Date Taking? Authorizing Provider  omeprazole (PRILOSEC OTC) 20 MG tablet Take 20 mg by mouth daily.   Yes [provider]  HYDROcodone-acetaminophen (NORCO) 5-325 MG tablet Take 1-2 tablets by mouth every 4 (four) hours as needed for moderate pain. Patient not taking: Reported on 02/17/2018 06/30/16   Abigail MiyamotoBlackman, Douglas, MD  propranolol ER (INDERAL LA) 60 MG 24 hr capsule Take 60 mg by  mouth daily.    [provider]  topiramate (TOPAMAX) 25 MG capsule Take 100 mg by mouth at bedtime.    [provider]    Family History Family History  Problem Relation Age of Onset  . Hypertension Mother   . Hypertension Father     Social History Social History   Tobacco Use  . Smoking status: Current Some Day Smoker  . Smokeless tobacco: Never Used  . Tobacco comment: hookah occ 3 time in last week  Substance Use Topics  . Alcohol use: No    Alcohol/week: 0.0 oz  . Drug use: No     Allergies   Penicillins   Review of Systems Review of Systems  Constitutional: Negative for appetite change.  HENT: Negative for congestion.   Respiratory: Negative for shortness of breath.   Cardiovascular: Negative for chest pain.  Gastrointestinal: Positive for nausea.  Genitourinary: Negative for flank pain.  Musculoskeletal: Negative for back pain.  Neurological: Positive for numbness. Negative for syncope, facial asymmetry, speech difficulty and headaches.  Hematological: Negative for adenopathy.  Psychiatric/Behavioral: Negative for confusion.     Physical Exam Updated Vital Signs BP 107/82   Pulse (!) 54   Temp 98.6 F (37 C) (Oral)   Resp 13   SpO2 100%   Physical Exam  Constitutional: He appears well-developed.  HENT:  Head: Atraumatic.  Eyes: Pupils are equal, round, and reactive to light.  Neck: Neck supple.  Cardiovascular: Normal rate.  Pulmonary/Chest: Effort normal.  Abdominal: Soft. There is no tenderness.  Musculoskeletal: He exhibits no edema.  Neurological: He is alert.  Skin: Skin is warm. Capillary refill takes less than 2 seconds.     ED Treatments / Results  Labs (all labs ordered are listed, but only abnormal results are displayed) Labs Reviewed  BASIC METABOLIC PANEL - Abnormal; Notable for the following components:      Result Value   CO2 21 (*)    Glucose, Bld 111 (*)    All other components within normal limits    CBC  URINALYSIS, ROUTINE W REFLEX MICROSCOPIC  CBG MONITORING, ED    EKG  EKG Interpretation  Date/Time:  Wednesday February 17 2018 09:28:13 EST Ventricular Rate:  72 PR Interval:    QRS Duration: 103 QT Interval:  402 QTC Calculation: 440 R Axis:   -24 Text Interpretation:  Sinus rhythm Borderline left axis deviation Confirmed by Benjiman Core [id number]) on 02/17/2018 9:44:31 AM       Radiology Ct Head Wo Contrast  Result Date: 02/17/2018 CLINICAL DATA:  Chronic headache EXAM: CT HEAD WITHOUT CONTRAST TECHNIQUE: Contiguous axial images were obtained from the base of the skull through the vertex without intravenous contrast. COMPARISON:  None. FINDINGS: Brain: No evidence of acute infarction, hemorrhage, hydrocephalus, extra-axial collection or mass lesion/mass effect. Vascular: Negative for acute vascular thrombosis Skull: Negative Sinuses/Orbits: Negative Other: None IMPRESSION: Negative CT head Electronically Signed   By: Marlan Palau M.D.   On: 02/17/2018 10:32    Procedures Procedures (including critical care time)  Medications Ordered in ED Medications - No data to display   Initial Impression / Assessment and Plan / ED Course  I have reviewed the triage vital signs and the nursing notes.  Pertinent labs & imaging results that were available during my care of the patient were reviewed by me and considered in my medical decision making (see chart for details).     Patient is episode of dizziness.  Face goes tingling.  Also has vision changes with some spots in the left eye.  Lab work and head CT reassuring.  Migrainous type cause considered with vision changes.  Will have follow-up with neurology.  Also has follow-up with cardiology and his PCP.  Will discharge home.  Final Clinical Impressions(s) / ED Diagnoses   Final diagnoses:  Dizziness    ED Discharge Orders    None       Benjiman Core, MD 02/17/18 1224

## 2018-02-17 NOTE — Discharge Instructions (Signed)
With your episodes of dizziness and vision changes.  Follow-up with neurology.  He was given resources for the 2 neurology groups that we refer to.  Follow-up with cardiology and her primary care doctor as planned also.

## 2018-02-18 ENCOUNTER — Ambulatory Visit (INDEPENDENT_AMBULATORY_CARE_PROVIDER_SITE_OTHER): Payer: 59 | Admitting: Neurology

## 2018-02-18 ENCOUNTER — Telehealth: Payer: Self-pay | Admitting: Neurology

## 2018-02-18 ENCOUNTER — Encounter: Payer: Self-pay | Admitting: Neurology

## 2018-02-18 VITALS — BP 135/88 | HR 70 | Ht 70.0 in | Wt 292.0 lb

## 2018-02-18 DIAGNOSIS — H905 Unspecified sensorineural hearing loss: Secondary | ICD-10-CM | POA: Diagnosis not present

## 2018-02-18 DIAGNOSIS — R51 Headache with orthostatic component, not elsewhere classified: Secondary | ICD-10-CM

## 2018-02-18 DIAGNOSIS — R519 Headache, unspecified: Secondary | ICD-10-CM

## 2018-02-18 DIAGNOSIS — R5382 Chronic fatigue, unspecified: Secondary | ICD-10-CM | POA: Diagnosis not present

## 2018-02-18 DIAGNOSIS — R0602 Shortness of breath: Secondary | ICD-10-CM

## 2018-02-18 DIAGNOSIS — H539 Unspecified visual disturbance: Secondary | ICD-10-CM | POA: Diagnosis not present

## 2018-02-18 DIAGNOSIS — H814 Vertigo of central origin: Secondary | ICD-10-CM

## 2018-02-18 DIAGNOSIS — R002 Palpitations: Secondary | ICD-10-CM | POA: Diagnosis not present

## 2018-02-18 DIAGNOSIS — R42 Dizziness and giddiness: Secondary | ICD-10-CM | POA: Diagnosis not present

## 2018-02-18 DIAGNOSIS — R200 Anesthesia of skin: Secondary | ICD-10-CM

## 2018-02-18 DIAGNOSIS — H8149 Vertigo of central origin, unspecified ear: Secondary | ICD-10-CM | POA: Diagnosis not present

## 2018-02-18 DIAGNOSIS — H9190 Unspecified hearing loss, unspecified ear: Secondary | ICD-10-CM

## 2018-02-18 MED ORDER — ONDANSETRON HCL 4 MG PO TABS: 4.0000 mg | ORAL_TABLET | Freq: Three times a day (TID) | ORAL | 6 refills | Status: DC | PRN

## 2018-02-18 NOTE — Progress Notes (Signed)
Epworth Sleepiness Scale 0= would never doze 1= slight chance of dozing 2= moderate chance of dozing 3= high chance of dozing  Sitting and reading: 1 Watching TV: 2 Sitting inactive in a public place (ex. Theater or meeting): 0 As a passenger in a car for an hour without a break: 0 Lying down to rest in the afternoon: 3 Sitting and talking to someone: 0 Sitting quietly after lunch (no alcohol): 1 In a car, while stopped in traffic: 0 Total: 7  FSS was 42

## 2018-02-18 NOTE — Patient Instructions (Signed)
MRI brain w/wo contrast Sleep evaluation Healthy weight and wellness Ondansetron three times a day  Ondansetron tablets What is this medicine? ONDANSETRON (on DAN se tron) is used to treat nausea and vomiting caused by chemotherapy. It is also used to prevent or treat nausea and vomiting after surgery. This medicine may be used for other purposes; ask your health care provider or pharmacist if you have questions. COMMON BRAND NAME(S): Zofran What should I tell my health care provider before I take this medicine? They need to know if you have any of these conditions: -heart disease -history of irregular heartbeat -liver disease -low levels of magnesium or potassium in the blood -an unusual or allergic reaction to ondansetron, granisetron, other medicines, foods, dyes, or preservatives -pregnant or trying to get pregnant -breast-feeding How should I use this medicine? Take this medicine by mouth with a glass of water. Follow the directions on your prescription label. Take your doses at regular intervals. Do not take your medicine more often than directed. Talk to your pediatrician regarding the use of this medicine in children. Special care may be needed. Overdosage: If you think you have taken too much of this medicine contact a poison control center or emergency room at once. NOTE: This medicine is only for you. Do not share this medicine with others. What if I miss a dose? If you miss a dose, take it as soon as you can. If it is almost time for your next dose, take only that dose. Do not take double or extra doses. What may interact with this medicine? Do not take this medicine with any of the following medications: -apomorphine -certain medicines for fungal infections like fluconazole, itraconazole, ketoconazole, posaconazole, voriconazole -cisapride -dofetilide -dronedarone -pimozide -thioridazine -ziprasidone This medicine may also interact with the following  medications: -carbamazepine -certain medicines for depression, anxiety, or psychotic disturbances -fentanyl -linezolid -MAOIs like Carbex, Eldepryl, Marplan, Nardil, and Parnate -methylene blue (injected into a vein) -other medicines that prolong the QT interval (cause an abnormal heart rhythm) -phenytoin -rifampicin -tramadol This list may not describe all possible interactions. Give your health care provider a list of all the medicines, herbs, non-prescription drugs, or dietary supplements you use. Also tell them if you smoke, drink alcohol, or use illegal drugs. Some items may interact with your medicine. What should I watch for while using this medicine? Check with your doctor or health care professional right away if you have any sign of an allergic reaction. What side effects may I notice from receiving this medicine? Side effects that you should report to your doctor or health care professional as soon as possible: -allergic reactions like skin rash, itching or hives, swelling of the face, lips or tongue -breathing problems -confusion -dizziness -fast or irregular heartbeat -feeling faint or lightheaded, falls -fever and chills -loss of balance or coordination -seizures -sweating -swelling of the hands or feet -tightness in the chest -tremors -unusually weak or tired Side effects that usually do not require medical attention (report to your doctor or health care professional if they continue or are bothersome): -constipation or diarrhea -headache This list may not describe all possible side effects. Call your doctor for medical advice about side effects. You may report side effects to FDA at 1-800-FDA-1088. Where should I keep my medicine? Keep out of the reach of children. Store between 2 and 30 degrees C (36 and 86 degrees F). Throw away any unused medicine after the expiration date. NOTE: This sheet is a summary. It may not  cover all possible information. If you have  questions about this medicine, talk to your doctor, pharmacist, or health care provider.  2018 Elsevier/Gold Standard (2013-09-21 16:27:45)

## 2018-02-18 NOTE — Telephone Encounter (Signed)
Patients referral has been sent to Dr. Malena EdmanBeasly and [facility].

## 2018-02-18 NOTE — Progress Notes (Signed)
[facility]    Provider:  Dr Lucia Gaskins Referring Provider: Eloisa Northern, MD Primary Care Physician:  Eloisa Northern, MD  CC:  Dizziness and forehead numbness  HPI:  Ronald George is a 37 y.o. male here as a referral from Dr. Nelson Chimes for numbness and tinglingin face.  Past medical history anxiety and depression. Started 2 weeks ago. In Mid December he started feeling washed out, lethargic possibly worse after eating. Fatigue progressed since then. He saw his pcp and reported irregular heart rate and was started on propranolol. Propranolol helped with the heart palps. His dose was increased and Topamax was added for appetite. He is struggling with weight loss. Fatigue is worsening. Since Topamax he has tingling in the forehead. He stopped taking topamax and palpitations a week ago. He feels pressure in his temples. He has nausea. Dizziness makes him vomit. He can't focus on one object. He feels like he is spinning. His equilibrium is off. Can last 4 hours. No light sensitivity. No pulsating headache. Has had headaches behind the eyes, just dull an din the back of his head which are positional. No FHx migraines.  He wakes with these symptoms often. Snores heavily, wakes often. Never had a sleep test. No other focal neurologic deficits, associated symptoms, inciting events or modifiable factors.  Reviewed notes, labs and imaging from outside physicians, which showed:   Reviewed referring physician notes.  Patient presented yesterday for episodes of dizziness, numbness and tingling to his face and head.  He also had nausea and confusion with the episodes.  After the episodes he will go to sleep and then wake up feeling worn out.  Having episodes for a month.  He was started on propranolol for palpitations.  He would feel his heart race at times.  This was not necessarily associated with the numbness symptoms.  The palpitations have improved but the numbness episodes have not.  No chest pain with  the episodes.  He does get blurred vision in the left eye with the episodes and some dots in the eye when it happens.  CT 02/17/18 showed No acute intracranial abnormalities including mass lesion or mass effect, hydrocephalus, extra-axial fluid collection, midline shift, hemorrhage, or acute infarction, large ischemic events (personally reviewed images)     Review of Systems: Patient complains of symptoms per HPI as well as the following symptoms: Confusion, numbness, weakness, slurred speech, dizziness, passing out, sleepiness, not enough sleep, decreased energy, blurred vision, fatigue, palpitations.. Pertinent negatives and positives per HPI. All others negative.   Social History   Socioeconomic History  . Marital status: Single    Spouse name: Not on file  . Number of children: Not on file  . Years of education: Not on file  . Highest education level: Some college, no degree  Social Needs  . Financial resource strain: Not on file  . Food insecurity - worry: Not on file  . Food insecurity - inability: Not on file  . Transportation needs - medical: Not on file  . Transportation needs - non-medical: Not on file  Occupational History  . Not on file  Tobacco Use  . Smoking status: Current Some Day Smoker  . Smokeless tobacco: Never Used  . Tobacco comment: hookah occ 3 time in last week  Substance and Sexual Activity  . Alcohol use: No    Alcohol/week: 0.0 oz  . Drug use: No  . Sexual activity: Not on file  Other Topics Concern  . Not on file  Social  History Narrative   Lives at home alone   Right handed   Drinks 1 cup of tea daily    Family History  Problem Relation Age of Onset  . Hypertension Mother   . Hypertension Father   . Migraines Neg Hx     Past Medical History:  Diagnosis Date  . Anxiety   . Depression     Past Surgical History:  Procedure Laterality Date  . CHOLECYSTECTOMY N/A 06/30/2016   Procedure: LAPAROSCOPIC CHOLECYSTECTOMY WITH INTRAOPERATIVE  CHOLANGIOGRAM;  Surgeon: Abigail Miyamotoouglas Blackman, MD;  Location: [facility] OR;  Service: General;  Laterality: N/A;  . NO PAST SURGERIES     no anesthesia ever    Current Outpatient Medications  Medication Sig Dispense Refill  . meclizine (ANTIVERT) 25 MG tablet Take 25 mg by mouth every 8 (eight) hours as needed for dizziness.    Marland Kitchen. omeprazole (PRILOSEC OTC) 20 MG tablet Take 20 mg by mouth daily.    . ondansetron (ZOFRAN) 4 MG tablet Take 1 tablet (4 mg total) by mouth every 8 (eight) hours as needed for nausea or vomiting. Or dizziness. 90 tablet 6  . propranolol ER (INDERAL LA) 60 MG 24 hr capsule Take 60 mg by mouth daily.    Marland Kitchen. topiramate (TOPAMAX) 25 MG capsule Take 25 mg by mouth at bedtime.      No current facility-administered medications for this visit.     Allergies as of 02/18/2018 - Review Complete 02/18/2018  Allergen Reaction Noted  . Penicillins  04/17/2015    Vitals: BP 135/88 (BP Location: Right Arm, Patient Position: Sitting)   Pulse 70   Ht 5\' 10"  (1.778 m)   Wt 292 lb (132.5 kg)   BMI 41.90 kg/m  Last Weight:  Wt Readings from Last 1 Encounters:  02/18/18 292 lb (132.5 kg)   Last Height:   Ht Readings from Last 1 Encounters:  02/18/18 5\' 10"  (1.778 m)     Physical exam: Exam: Gen: NAD, conversant, well nourised, morbidly obese, well groomed                     CV: RRR, no MRG. No Carotid Bruits. No peripheral edema, warm, nontender Eyes: Conjunctivae clear without exudates or hemorrhage  Neuro: Detailed Neurologic Exam  Speech:    Speech is normal; fluent and spontaneous with normal comprehension.  Cognition:    The patient is oriented to person, place, and time;     recent and remote memory intact;     language fluent;     normal attention, concentration,     fund of knowledge Cranial Nerves:    The pupils are equal, round, and reactive to light.  Nasal blurring, possible papilledema.  Visual fields are full to finger confrontation. Extraocular movements  are intact. Trigeminal sensation is intact and the muscles of mastication are normal. The face is symmetric. The palate elevates in the midline. Hearing intact. Voice is normal. Shoulder shrug is normal. The tongue has normal motion without fasciculations.   Coordination:    Normal finger to nose and heel to shin. Normal rapid alternating movements.   Gait:    Heel-toe and tandem gait are normal.   Motor Observation:    No asymmetry, no atrophy, and no involuntary movements noted. Tone:    Normal muscle tone.    Posture:    Posture is normal. normal erect    Strength:    Strength is V/V in the upper and lower limbs.  Sensation: intact to LT     Reflex Exam:  DTR's:    Deep tendon reflexes in the upper and lower extremities are normal bilaterally.   Toes:    The toes are downgoing bilaterally.   Clonus:    Clonus is absent.     Assessment/Plan:  37 year old male here with dizziness, vertigo, sensory changes of the face, snoring, excessive fatigue, vision and hearing changes.   - MRI brain w/wo contrast due to concerning symptoms of positional headache, vision and hearing changes, occipital location, sensory changes face, possible papilledema need MRI to eval for space-occupying lesion, masses, chiari, schwannoma, pseudotumor cerebri or any other intracranial etiology.  - Sleep evaluation for OSA: morning headache, excessive fatigue. Patient's ESS is only 7 (FSS 42) however he has many risk factors for OSA including morbid obesity, large neck circumference, Malampati 3 or 4. He never feels well rested, wakes often.  - Morbid obesity [facility] wellness center  -Ondansetron prn  - palpitations, fatigue, morbid obesity, SOB: cardiology referral   Orders Placed This Encounter  Procedures  . MR BRAIN W WO CONTRAST  . Ambulatory referral to [facility]  . Ambulatory referral to Sleep Studies  . Ambulatory referral to Cardiology   Cc: Dr. Zacarias Pontes, MD  [facility] [address] [place], [place] [address]  Phone [phone] Fax [phone]

## 2018-02-22 ENCOUNTER — Ambulatory Visit (INDEPENDENT_AMBULATORY_CARE_PROVIDER_SITE_OTHER): Payer: 59 | Admitting: Neurology

## 2018-02-22 ENCOUNTER — Encounter: Payer: Self-pay | Admitting: Neurology

## 2018-02-22 VITALS — BP 151/84 | HR 68 | Ht 70.0 in | Wt 295.0 lb

## 2018-02-22 DIAGNOSIS — G4719 Other hypersomnia: Secondary | ICD-10-CM | POA: Diagnosis not present

## 2018-02-22 DIAGNOSIS — R0683 Snoring: Secondary | ICD-10-CM | POA: Diagnosis not present

## 2018-02-22 DIAGNOSIS — Z820 Family history of epilepsy and other diseases of the nervous system: Secondary | ICD-10-CM

## 2018-02-22 DIAGNOSIS — Z6841 Body Mass Index (BMI) 40.0 and over, adult: Secondary | ICD-10-CM

## 2018-02-22 DIAGNOSIS — G478 Other sleep disorders: Secondary | ICD-10-CM | POA: Diagnosis not present

## 2018-02-22 NOTE — Progress Notes (Signed)
Subjective:    Patient ID: Domnique Vantine is a 37 y.o. male.  HPI     Huston Foley, MD, PhD [facility] [address], [address] [address] [place], [place] [address]  Dear Desma Maxim,   I saw your patient, Brailon Don, upon your kind request in my clinic today for initial consultation of his sleep disorder, in particular, concern for underlying obstructive sleep apnea. The patient is unaccompanied today. As you know, Mr. Borgmeyer is a 37 year old right-handed gentleman with an underlying medical history of reflux disease, anxiety, depression, facial tingling, dizziness, and morbid obesity with a BMI of over 40, who reports snoring, nonrestorative sleep, daytime tiredness. I reviewed your office note from 02/18/2018. He is divorced, he lives alone, he has no children. He works for [employer]. He smokes Hookah about 3 times a week, does not drink alcohol on a regular basis, drinks caffeine, typically once a day. He does endorse daytime tiredness. He has had some blurry vision. Has not seen an eye doctor.  His bedtime varies , typically between 10 PM and midnight, rise time between 4:30 and 5. He does not have night to night nocturia. He denies morning headaches. He has woken up with a sense of pressure in his temples. His father has sleep apnea and uses a CPAP machine. Working on weight loss. He was placed on Topamax for weight loss purposes per PCP. He went to the emergency room last week because of dizziness, palpitations, chest discomfort and tingling. He is no longer on propranolol, started metoprolol last week. He is supposed to see a cardiologist. He is scheduled for a brain MRI as well.   His Past Medical History Is Significant For: Past Medical History:  Diagnosis Date  . Anxiety   . Depression     His Past Surgical History Is Significant For: Past Surgical History:  Procedure Laterality Date  . CHOLECYSTECTOMY N/A 06/30/2016   Procedure: LAPAROSCOPIC  CHOLECYSTECTOMY WITH INTRAOPERATIVE CHOLANGIOGRAM;  Surgeon: Abigail Miyamoto, MD;  Location: [facility] OR;  Service: General;  Laterality: N/A;  . NO PAST SURGERIES     no anesthesia ever    His Family History Is Significant For: Family History  Problem Relation Age of Onset  . Hypertension Mother   . Hypertension Father   . Migraines Neg Hx     His Social History Is Significant For: Social History   Socioeconomic History  . Marital status: Single    Spouse name: None  . Number of children: None  . Years of education: None  . Highest education level: Some college, no degree  Social Needs  . Financial resource strain: None  . Food insecurity - worry: None  . Food insecurity - inability: None  . Transportation needs - medical: None  . Transportation needs - non-medical: None  Occupational History  . None  Tobacco Use  . Smoking status: Current Some Day Smoker  . Smokeless tobacco: Never Used  . Tobacco comment: hookah occ 3 time in last week  Substance and Sexual Activity  . Alcohol use: No    Alcohol/week: 0.0 oz  . Drug use: No  . Sexual activity: None  Other Topics Concern  . None  Social History Narrative   Lives at home alone   Right handed   Drinks 1 cup of tea daily    His Allergies Are:  Allergies  Allergen Reactions  . Penicillins     Not sure on rx. Had rx as child.  Has patient  had a PCN reaction causing immediate rash, facial/tongue/throat swelling, SOB or lightheadedness with hypotension: n/a Has patient had a PCN reaction causing severe rash involving mucus membranes or skin necrosis: n/a Has patient had a PCN reaction that required hospitalization: n/a Has patient had a PCN reaction occurring within the last 10 years: n/a If all of the above answers are "NO", then may proceed with Cephalosporin use.   :   His Current Medications Are:  Outpatient Encounter Medications as of 02/22/2018  Medication Sig  . meclizine (ANTIVERT) 25 MG tablet Take 25 mg  by mouth every 8 (eight) hours as needed for dizziness.  Marland Kitchen omeprazole (PRILOSEC OTC) 20 MG tablet Take 20 mg by mouth daily.  . ondansetron (ZOFRAN) 4 MG tablet Take 1 tablet (4 mg total) by mouth every 8 (eight) hours as needed for nausea or vomiting. Or dizziness.  . propranolol ER (INDERAL LA) 60 MG 24 hr capsule Take 60 mg by mouth daily.  Marland Kitchen topiramate (TOPAMAX) 25 MG capsule Take 25 mg by mouth at bedtime.    No facility-administered encounter medications on file as of 02/22/2018.   :  Review of Systems:  Out of a complete 14 point review of systems, all are reviewed and negative with the exception of these symptoms as listed below: Review of Systems  Neurological:       Pt presents today to discuss his sleep. Pt has never had a sleep study but does endorse snoring.  Epworth Sleepiness Scale 0= would never doze 1= slight chance of dozing 2= moderate chance of dozing 3= high chance of dozing  Sitting and reading: 1 Watching TV: 2 Sitting inactive in a public place (ex. Theater or meeting): 0 As a passenger in a car for an hour without a break: 0 Lying down to rest in the afternoon: 3 Sitting and talking to someone: 0 Sitting quietly after lunch (no alcohol): 1 In a car, while stopped in traffic: 0 Total: 7  FSS was 42   Objective:  Neurological Exam  Physical Exam Physical Examination:   Vitals:   02/22/18 1626  BP: (!) 151/84  Pulse: 68   General Examination: The patient is a very pleasant 37 y.o. male in no acute distress. He appears well-developed and well-nourished and well groomed.   HEENT: Normocephalic, atraumatic, pupils are equal, round and reactive to light and accommodation. Extraocular tracking is good without limitation to gaze excursion or nystagmus noted. Normal smooth pursuit is noted. Hearing is grossly intact. Tympanic membranes are clear bilaterally. Face is symmetric with normal facial animation and normal facial sensation. Speech is clear with  no dysarthria noted. There is no hypophonia. There is no lip, neck/head, jaw or voice tremor. Neck is supple with full range of passive and active motion. There are no carotid bruits on auscultation. Oropharynx exam reveals: mild mouth dryness, good dental hygiene and moderate airway crowding, due to smaller airway entry, redundant soft palate, large uvula, tonsils are 1+. Mallampati is class II. Tongue protrudes centrally and palate elevates symmetrically. Neck circumference is 20-1/4 inch He has a Mild overbite.   Chest: Clear to auscultation without wheezing, rhonchi or crackles noted.  Heart: S1+S2+0, regular and normal without murmurs, rubs or gallops noted.   Abdomen: Soft, non-tender and non-distended with normal bowel sounds appreciated on auscultation.  Extremities: There is no pitting edema in the distal lower extremities bilaterally. Pedal pulses are intact.  Skin: Warm and dry without trophic changes noted.  Musculoskeletal: exam reveals no obvious  joint deformities, tenderness or joint swelling or erythema.   Neurologically:  Mental status: The patient is awake, alert and oriented in all 4 spheres. His immediate and remote memory, attention, language skills and fund of knowledge are appropriate. There is no evidence of aphasia, agnosia, apraxia or anomia. Speech is clear with normal prosody and enunciation. Thought process is linear. Mood is normal and affect is normal.  Cranial nerves II - XII are as described above under HEENT exam. In addition: shoulder shrug is normal with equal shoulder height noted. Motor exam: Normal bulk, strength and tone is noted. There is no drift, tremor or rebound. Romberg is negative. Reflexes are 2+ throughout. Fine motor skills and coordination: grossly intact.  Cerebellar testing: No dysmetria or intention tremor. There is no truncal or gait ataxia.  Sensory exam: intact to light touch in the upper and lower extremities.  Gait, station and balance:  He stands easily. No veering to one side is noted. No leaning to one side is noted. Posture is age-appropriate and stance is narrow based. Gait shows normal stride length and normal pace. No problems turning are noted. Tandem walk is unremarkable.  Assessment and Plan:  In summary, Georgina QuintBrandon Heuer is a very pleasant 37 y.o.-year old male with an underlying medical history of reflux disease, anxiety, depression, facial tingling, dizziness, and morbid obesity with a BMI of over 40, whose history and physical exam are concerning for obstructive sleep apnea (OSA). I had a long chat with the patient about my findings and the diagnosis of OSA, its prognosis and treatment options. We talked about medical treatments, surgical interventions and non-pharmacological approaches. I explained in particular the risks and ramifications of untreated moderate to severe OSA, especially with respect to developing cardiovascular disease down the Road, including congestive heart failure, difficult to treat hypertension, cardiac arrhythmias, or stroke. Even type 2 diabetes has, in part, been linked to untreated OSA. Symptoms of untreated OSA include daytime sleepiness, memory problems, mood irritability and mood disorder such as depression and anxiety, lack of energy, as well as recurrent headaches, especially morning headaches. We talked about trying to maintain a healthy lifestyle in general, as well as the importance of weight control. I encouraged the patient to eat healthy, exercise daily and keep well hydrated, to keep a scheduled bedtime and wake time routine, to not skip any meals and eat healthy snacks in between meals. I advised the patient not to drive when feeling sleepy. I recommended the following at this time: sleep study with potential positive airway pressure titration. (We will score hypopneas at 4%).   I explained the sleep test procedure to the patient and also outlined possible surgical and non-surgical  treatment options of OSA, including the use of a custom-made dental device (which would require a referral to a specialist dentist or oral surgeon), upper airway surgical options, such as pillar implants, radiofrequency surgery, tongue base surgery, and UPPP (which would involve a referral to an ENT surgeon). Rarely, jaw surgery such as mandibular advancement may be considered.  I also explained the CPAP treatment option to the patient, who indicated that he would be willing to try CPAP if the need arises. I explained the importance of being compliant with PAP treatment, not only for insurance purposes but primarily to improve His symptoms, and for the patient's long term health benefit, including to reduce His cardiovascular risks. I answered all his questions today and the patient was in agreement. I will likely see him back after the sleep  study is completed and encouraged him to call with any interim questions, concerns, problems or updates.   Thank you very much for allowing me to participate in the care of this nice patient. If I can be of any further assistance to you please do not hesitate to talk to me.  Sincerely,   Huston Foley, MD, PhD

## 2018-02-22 NOTE — Patient Instructions (Addendum)

## 2018-02-24 ENCOUNTER — Telehealth: Payer: Self-pay

## 2018-02-24 DIAGNOSIS — G4719 Other hypersomnia: Secondary | ICD-10-CM

## 2018-02-24 NOTE — Telephone Encounter (Signed)
HST order placed. 

## 2018-02-24 NOTE — Telephone Encounter (Signed)
[employer] denied in sleep study, need HST order

## 2018-03-02 ENCOUNTER — Ambulatory Visit: Payer: 59

## 2018-03-02 DIAGNOSIS — R42 Dizziness and giddiness: Secondary | ICD-10-CM

## 2018-03-02 DIAGNOSIS — R200 Anesthesia of skin: Secondary | ICD-10-CM

## 2018-03-02 DIAGNOSIS — R51 Headache with orthostatic component, not elsewhere classified: Secondary | ICD-10-CM

## 2018-03-02 DIAGNOSIS — H539 Unspecified visual disturbance: Secondary | ICD-10-CM | POA: Diagnosis not present

## 2018-03-02 DIAGNOSIS — H9190 Unspecified hearing loss, unspecified ear: Secondary | ICD-10-CM

## 2018-03-02 DIAGNOSIS — H905 Unspecified sensorineural hearing loss: Secondary | ICD-10-CM | POA: Diagnosis not present

## 2018-03-02 DIAGNOSIS — H814 Vertigo of central origin: Secondary | ICD-10-CM

## 2018-03-02 DIAGNOSIS — H8149 Vertigo of central origin, unspecified ear: Secondary | ICD-10-CM

## 2018-03-02 DIAGNOSIS — R519 Headache, unspecified: Secondary | ICD-10-CM

## 2018-03-02 MED ORDER — GADOPENTETATE DIMEGLUMINE 469.01 MG/ML IV SOLN: 20.0000 mL | Freq: Once | INTRAVENOUS | Status: AC | PRN

## 2018-03-08 ENCOUNTER — Telehealth: Payer: Self-pay | Admitting: Neurology

## 2018-03-08 ENCOUNTER — Ambulatory Visit (INDEPENDENT_AMBULATORY_CARE_PROVIDER_SITE_OTHER): Payer: 59 | Admitting: Neurology

## 2018-03-08 DIAGNOSIS — G4733 Obstructive sleep apnea (adult) (pediatric): Secondary | ICD-10-CM | POA: Diagnosis not present

## 2018-03-08 DIAGNOSIS — G4719 Other hypersomnia: Secondary | ICD-10-CM

## 2018-03-08 NOTE — Telephone Encounter (Signed)
Patient would like a call back regarding MRI results. Best call back [phone]

## 2018-03-08 NOTE — Telephone Encounter (Signed)
Called pt and informed him that while his MRI has been completed, Dr. Lucia GaskinsAhern will have to review it and comment before we can discuss results with him. Results likely will be released this week. He verbalized appreciation and will await a call.

## 2018-03-08 NOTE — Telephone Encounter (Signed)
MRI of the brain was unremarkable except for some mildly increased fluid behind the eyes. This can be seen in a condition called Idiopathic Intracranial Hypertension, a condition where there is extra fluid in the brain unsually associated with obesity. This can cause headaches. I would like to check for this condition by ordering a lumbar puncture. We can treat with weight loss and medication called Diamox. Please let me know if patient is interested and I can order the LP thanks

## 2018-03-09 NOTE — Telephone Encounter (Signed)
Spoke with patient and discussed the following from Dr. Lucia GaskinsAhern re: MRI brain-  MRI of the brain was unremarkable except for some mildly increased fluid behind the eyes. This can be seen in a condition called Idiopathic Intracranial Hypertension, a condition where there is extra fluid in the brain unsually associated with obesity. This can cause headaches. Dr. Lucia GaskinsAhern would like to check for this condition by ordering a lumbar puncture. We can treat with weight loss and medication called Diamox.   The patient's questions were answered and he stated he also would do some research and think about it. He will call back to let us know.

## 2018-03-10 NOTE — Telephone Encounter (Signed)
Spoke with pt. He would like to get the LP scheduled. Informed pt will place order for GI location and they will call him to get the appt scheduled. He verbalized appreciation.

## 2018-03-10 NOTE — Telephone Encounter (Signed)
Pt called wanting to speak with RN about getting his LP ordered, please call to advise

## 2018-03-11 ENCOUNTER — Telehealth: Payer: Self-pay

## 2018-03-11 ENCOUNTER — Other Ambulatory Visit: Payer: Self-pay | Admitting: Neurology

## 2018-03-11 DIAGNOSIS — G4733 Obstructive sleep apnea (adult) (pediatric): Secondary | ICD-10-CM

## 2018-03-11 DIAGNOSIS — G932 Benign intracranial hypertension: Secondary | ICD-10-CM

## 2018-03-11 NOTE — Procedures (Signed)
[facility] [address] [place], [place] [address] NAME:  Ronald QuintBrandon Mckillop                                                        DOB: 07/24/1981 MEDICAL RECORD [id number]                                                DOS: 03/08/2018 REFERRING PHYSICIAN: Naomie DeanAntonia Ahern, MD STUDY PERFORMED: Home Sleep Test HISTORY:  37 year old man with a history of reflux disease, anxiety, depression, facial tingling, dizziness, and morbid obesity with a BMI of over 40, who reports snoring, nonrestorative sleep, daytime tiredness. BMI:42.3  STUDY RESULTS:  Total Recording Time:   6 hours   6 minutes Total Apnea/Hypopnea Index (AHI):  7.8 /h, RDI: 9.7/h Average Oxygen Saturation:   95%, lowest Oxygen Desaturation: 87%  Total Time Oxygen Saturation Below or at 88%: 1.0 minutes  Average Heart Rate: 62 bpm (between 51 and  94  bpm) IMPRESSION: OSA RECOMMENDATION: This home sleep test demonstrates overall mild obstructive sleep apnea with a total AHI of 7.8/hour and O2 nadir of 87%. Given the patient's medical history and sleep related complaints, treatment with positive airway pressure is recommended. This can be achieved in the form of autoPAP trial/titration at home. A full night CPAP titration study will help with proper treatment settings and mask fitting if needed. Alternative treatment options would be weight loss along with avoidance of the supine sleep position, an oral appliance (through a qualified dentist), or surgical intervention. Please note that untreated obstructive sleep apnea may carry additional perioperative morbidity. Patients with significant obstructive sleep apnea should receive perioperative PAP therapy and the surgeons and particularly the anesthesiologist should be informed of the diagnosis and the severity of the sleep disordered breathing. The patient should be cautioned not to drive, work at heights, or operate dangerous or heavy equipment when tired or  sleepy. Review and reiteration of good sleep hygiene measures should be pursued with any patient. Other causes of the patient's symptoms, including circadian rhythm disturbances, an underlying mood disorder, medication effect and/or an underlying medical problem cannot be ruled out based on this test. Clinical correlation is recommended. The patient and his referring provider will be notified of the test results. The patient will be seen in follow up in sleep clinic at [facility].  I certify that I have reviewed the raw data recording prior to the issuance of this report in accordance with the standards of the American Academy of Sleep Medicine (AASM).  Huston FoleySaima Krosby Ritchie, MD, PhD [facility] Diplomat, ABPN (Neurology and Sleep)

## 2018-03-11 NOTE — Telephone Encounter (Signed)
-----   Message from Saima Athar, MD sent at 03/11/2018  8:14 AM EDT ----- Patient referred by Dr. Lucia GaskinsAhern, seen byHuston Foley me on 02/22/18, HST on 03/08/18.    Please call and notify the patient that the recent home sleep test showed obstructive sleep apnea. OSA is overall mild, but worth treating to see if he feels better after treatment. To that end I recommend treatment for this in the form of autoPAP, which means, that we don't have to bring him in for a sleep study with CPAP, but will let him try an autoPAP machine at home, through a DME company (of his choice, or as per insurance requirement). The DME representative will educate him on how to use the machine, how to put the mask on, etc. I have placed an order in the chart. Please send referral, talk to patient, send report to referring MD. We will need a FU in sleep clinic for 10 weeks post-PAP set up, please arrange that with me or one of our NPs. Thanks,   Huston FoleySaima Athar, MD, PhD [facility]

## 2018-03-11 NOTE — Progress Notes (Signed)
Patient referred by Dr. Lucia GaskinsAhern, seen by me on 02/22/18, HST on 03/08/18.    Please call and notify the patient that the recent home sleep test showed obstructive sleep apnea. OSA is overall mild, but worth treating to see if he feels better after treatment. To that end I recommend treatment for this in the form of autoPAP, which means, that we don't have to bring him in for a sleep study with CPAP, but will let him try an autoPAP machine at home, through a DME company (of his choice, or as per insurance requirement). The DME representative will educate him on how to use the machine, how to put the mask on, etc. I have placed an order in the chart. Please send referral, talk to patient, send report to referring MD. We will need a FU in sleep clinic for 10 weeks post-PAP set up, please arrange that with me or one of our NPs. Thanks,   Huston FoleySaima Tyauna Lacaze, MD, PhD [facility])

## 2018-03-11 NOTE — Telephone Encounter (Signed)
Ordered, thanks.

## 2018-03-11 NOTE — Telephone Encounter (Signed)
I called pt. I advised pt that Dr. Frances FurbishAthar reviewed their sleep study results and found that pt has mild osa. Dr. Frances FurbishAthar recommends that pt start an auto pap at home. I reviewed PAP compliance expectations with the pt. Pt is agreeable to starting an auto-PAP. I advised pt that an order will be sent to a DME, [facility], and [facility] will call the pt within about one week after they file with the pt's insurance. [facility] will show the pt how to use the machine, fit for masks, and troubleshoot the auto-PAP if needed. A follow up appt was made for insurance purposes with Dr. Frances FurbishAthar on 06/09/18 at 10:30am. Pt verbalized understanding to arrive 15 minutes early and bring their auto-PAP. A letter with all of this information in it will be mailed to the pt as a reminder. I verified with the pt that the address we have on file is correct. Pt verbalized understanding of results. Pt had no questions at this time but was encouraged to call back if questions arise.

## 2018-03-31 ENCOUNTER — Telehealth: Payer: Self-pay | Admitting: *Deleted

## 2018-03-31 ENCOUNTER — Ambulatory Visit
Admission: RE | Admit: 2018-03-31 | Discharge: 2018-03-31 | Disposition: A | Discharge status: Home / Self Care | Payer: 59 | Source: Ambulatory Visit | Attending: Neurology | Admitting: Neurology

## 2018-03-31 DIAGNOSIS — G932 Benign intracranial hypertension: Secondary | ICD-10-CM

## 2018-03-31 LAB — CSF CELL COUNT WITH DIFFERENTIAL
RBC COUNT CSF: 2 {cells}/uL (ref 0–10)
WBC CSF: 2 {cells}/uL (ref 0–5)

## 2018-03-31 LAB — GLUCOSE, CSF: GLUCOSE CSF: 67 mg/dL (ref 40–80)

## 2018-03-31 LAB — PROTEIN, CSF: TOTAL PROTEIN, CSF: 23 mg/dL (ref 15–45)

## 2018-03-31 NOTE — Discharge Instructions (Signed)

## 2018-03-31 NOTE — Telephone Encounter (Signed)
[facility]. CSF specimens were collected and tested today 03/31/2018 and have resulted, nothing urgent [facility]. However, there were some parts of the testing not resulted due to insufficient analyzable cells. Will receive the faxed results within 10 minutes which has further details.

## 2018-04-01 ENCOUNTER — Telehealth: Payer: Self-pay | Admitting: Neurology

## 2018-04-01 ENCOUNTER — Other Ambulatory Visit: Payer: Self-pay | Admitting: Neurology

## 2018-04-01 DIAGNOSIS — G932 Benign intracranial hypertension: Secondary | ICD-10-CM

## 2018-04-01 MED ORDER — ACETAZOLAMIDE ER 500 MG PO CP12: 500.0000 mg | ORAL_CAPSULE | Freq: Two times a day (BID) | ORAL | 6 refills | Status: DC

## 2018-04-01 NOTE — Telephone Encounter (Signed)
Left detailed message for patient. Asked him to call us back. 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 I would like to start him on a medication called Diamox(acetazolamide). It has similar symptoms to Topiramate, listed them on his voicemail. Would like him to stop topiramate and switch to diamox. Also ensure he has a follow up with me in about 8 weeks. I've already called it in, please just call back to ensure he got the entire message thanks. Thanks.

## 2018-04-01 NOTE — Telephone Encounter (Signed)
Patient called back and discussed with Dr. Lucia GaskinsAhern via telephone.

## 2018-04-02 ENCOUNTER — Encounter: Payer: Self-pay | Admitting: Neurology

## 2018-04-04 ENCOUNTER — Telehealth: Payer: 59 | Admitting: Physician Assistant

## 2018-04-04 ENCOUNTER — Telehealth: Payer: Self-pay | Admitting: Neurology

## 2018-04-04 DIAGNOSIS — T50905A Adverse effect of unspecified drugs, medicaments and biological substances, initial encounter: Secondary | ICD-10-CM

## 2018-04-04 NOTE — Telephone Encounter (Signed)
he is having diarrhea and N/V on Diamox 500 mg,.  He did stop the Topamax.   I offered to call in the 125 mg to titrate over a week or so but he wants to try to get the higher dose in.  He will take 500 mg only once today to see how he does.   Advised to try Imodium and.or pepto bismo for the GI symptoms  He would like to discuss further on Monday

## 2018-04-04 NOTE — Progress Notes (Signed)
Based on what you shared with me it looks like you have a condition that should be evaluated in a face to face office visit. I am concerned that symptoms are stemming from the new medication you just started. I want you to call the number for your neurologist office. There should be a nurse there that will guide your concerns either to Dr. Lucia GaskinsAhern or one of her colleagues that is on call this weekend. That way they can give you suggestions for changing the medication. Keep very well hydrated and follow a very bland diet to help with diarrhea. You can take Imodium over the counter if you need it to slow this down.  NOTE: If you entered your credit card information for this eVisit, you will not be charged. You may see a "hold" on your card for the $30 but that hold will drop off and you will not have a charge processed.  If you are having a true medical emergency please call 911.  If you need an urgent face to face visit, [place] has four urgent care centers for your convenience.  If you need care fast and have a high deductible or no insurance consider:   [email or web address] to reserve your spot online an avoid wait times  [facility] [address], [address] [place], [place] [address] 8 am to 8 pm Monday-Friday 10 am to 4 pm Saturday-Sunday *Across the street from [employer]  [facility]  [address] [place], [id number] 8 am to 5 pm Monday-Friday * In the [facility] on the [facility]   The following sites will take your  insurance:  . [facility]  [phone] Get Driving Directions Find a Provider at this Location  [address] [place], [place] [address] . 10 am to 8 pm Monday-Friday . 12 pm to 8 pm Saturday-Sunday   . [facility]  [phone] Get Driving Directions Find a Provider at this Location  [address], [address] [place], [place] [address] . 8 am to 8  pm Monday-Friday . 9 am to 6 pm Saturday . 11 am to 6 pm Sunday   . [facility] [place]  [phone] Get Driving Directions  [address] [place], [place] [address] . 8 am to 8 pm Monday-Friday . 8 am to 4 pm Saturday-Sunday   Your e-visit answers were reviewed by a board certified advanced clinical practitioner to complete your personal care plan.  Thank you for using e-Visits.

## 2018-04-04 NOTE — Telephone Encounter (Signed)
Toma CopierBethany, I agree with Dr. Epimenio FootSater we can start lower and slowly titrate. Start with 125mg  twice daily for 2 weeks, then 125mg  three times daily for 2 weeks. If he does well we can increase to 250mg  twice daily and go from there. Please give him a call thanks

## 2018-04-05 MED ORDER — ACETAZOLAMIDE 250 MG PO TABS: 250.0000 mg | ORAL_TABLET | Freq: Two times a day (BID) | ORAL | 6 refills | Status: DC

## 2018-04-05 NOTE — Addendum Note (Signed)
Addended by: Bertram SavinULBERTSON, BETHANY L on: 04/05/2018 05:16 PM   Modules accepted: Orders

## 2018-04-05 NOTE — Telephone Encounter (Addendum)
Spoke with the patient. He started on Diamox 500 mg BID on Friday. Saturday morning early he woke up with stomach issues. Today he is only experiencing nausea and no appetite. Discussed options per Dr. Lucia GaskinsAhern of starting with 125mg  twice daily for 2 weeks, then 125mg  three times daily for 2 weeks. If he does well we can increase to 250mg  twice daily and go from there. The patient wanted to decrease to 250 mg BID. This was d/w Dr. Lucia GaskinsAhern and she gave verbal order.   Diamox 250 mg BID, #60, 6 refills e-scribed to pt's pharmacy per v.o. from Dr. Lucia GaskinsAhern.   Called pt's pharmacy and spoke with Alcario DroughtErica, tech. She is aware that Diamox has been changed to 250 mg BID and they will get the order ready for the patient.

## 2018-04-12 ENCOUNTER — Other Ambulatory Visit: Payer: Self-pay | Admitting: Neurology

## 2018-04-12 DIAGNOSIS — G932 Benign intracranial hypertension: Secondary | ICD-10-CM

## 2018-04-29 ENCOUNTER — Encounter: Payer: Self-pay | Admitting: Neurology

## 2018-04-29 MED ORDER — ACETAZOLAMIDE 250 MG PO TABS: 250.0000 mg | ORAL_TABLET | Freq: Two times a day (BID) | ORAL | 1 refills | Status: DC

## 2018-05-05 ENCOUNTER — Encounter: Payer: Self-pay | Admitting: Neurology

## 2018-06-09 ENCOUNTER — Ambulatory Visit: Payer: Self-pay | Admitting: Neurology

## 2018-06-13 ENCOUNTER — Encounter: Payer: Self-pay | Admitting: Neurology

## 2019-02-19 ENCOUNTER — Other Ambulatory Visit: Payer: Self-pay | Admitting: Neurology

## 2019-02-23 ENCOUNTER — Telehealth: Payer: Self-pay

## 2019-02-23 NOTE — Telephone Encounter (Signed)
Please offer patient appt to discuss alternative Rx options for OSA and potential referral to dentistry, etc.

## 2019-02-23 NOTE — Telephone Encounter (Signed)
I sent pt a mychart message.

## 2019-02-23 NOTE — Telephone Encounter (Signed)
Received notice from [facility] that pt returned his cpap on 02/01/2019. Pt's compliance was 0%, per [facility]. Pt's HST on 03/08/2018 showed an AHI of 7.8 and an O2 nadir of 87%. Pt has not followed up in the sleep clinic since his initial sleep consult in Feb of 2019.

## 2019-02-24 NOTE — Telephone Encounter (Signed)
I called pt to discuss. No answer, VM is full. Unable to leave a message. I also sent pt a mychart message yesterday regarding this issue.

## 2019-02-24 NOTE — Telephone Encounter (Signed)
Pt returned call. He will call back to schedule a follow up when he receives his schedule at work for next month.

## 2019-12-21 ENCOUNTER — Ambulatory Visit
Admission: EM | Admit: 2019-12-21 | Discharge: 2019-12-21 | Disposition: A | Discharge status: Home / Self Care | Payer: PRIVATE HEALTH INSURANCE | Attending: Family Medicine | Admitting: Family Medicine

## 2019-12-21 ENCOUNTER — Encounter: Payer: Self-pay | Admitting: Emergency Medicine

## 2019-12-21 ENCOUNTER — Other Ambulatory Visit: Payer: Self-pay

## 2019-12-21 DIAGNOSIS — J069 Acute upper respiratory infection, unspecified: Secondary | ICD-10-CM | POA: Diagnosis not present

## 2019-12-21 DIAGNOSIS — Z20822 Contact with and (suspected) exposure to covid-19: Secondary | ICD-10-CM

## 2019-12-21 DIAGNOSIS — Z7189 Other specified counseling: Secondary | ICD-10-CM

## 2019-12-21 NOTE — ED Triage Notes (Signed)
Patient was exposed to [place] on Saturday. Patient is having a headache and low grade temp at night, loss of appetite and generalized body aches.

## 2019-12-21 NOTE — Discharge Instructions (Addendum)
It was very nice seeing you today in clinic. Thank you for entrusting me with your care.   Rest and stay HYDRATED. Water and electrolyte containing beverages (Gatorade, Pedialyte) are best to prevent dehydration and electrolyte abnormalities.   May use Tylenol and/or Ibuprofen as needed for pain/fever.   You were tested for SARS-CoV-2 (novel coronavirus) today. Testing is performed by an outside lab ([facility]) and has variable turn around times ranging between 24-48 hours. Current recommendations from the the CDC and [place] DHHS require that you remain out of work in order to quarantine at home until negative test results are have been received. In the event that your test results are positive, you will be contacted with further directives. These measures are being implemented out of an abundance of caution to prevent transmission and spread during the current SARS-CoV-2 pandemic.  Make arrangements to follow up with your regular doctor in 1 week for re-evaluation if not improving. If your symptoms/condition worsens, please seek follow up care either here or in the ER. Please remember, our [place] providers are "right here with you" when you need us.   Again, it was my pleasure to take care of you today. Thank you for choosing our clinic. I hope that you start to feel better quickly.   Baby Stairs, MSN, APRN, FNP-C, CEN Advanced Practice Provider [facility] 

## 2019-12-22 LAB — NOVEL CORONAVIRUS, NAA (HOSP ORDER, SEND-OUT TO REF LAB; TAT 18-24 HRS): SARS-CoV-2, NAA: NOT DETECTED

## 2019-12-23 NOTE — ED Provider Notes (Signed)
[place]   Name: Ronald George DOB: 03/29/1981 MRN: [id number] CSN: [id number] PCP: Eloisa Northern, MD  Arrival date and time:  12/21/19 1255  Chief Complaint:  Cough, Headache, Generalized Body Aches, and COVID exposure   NOTE: Prior to seeing the patient today, I have reviewed the triage nursing documentation and vital signs. Clinical staff has updated patient's PMH/PSHx, current medication list, and drug allergies/intolerances to ensure comprehensive history available to assist in medical decision making.   History:   HPI: Ronald George is a 38 y.o. male who presents today with complaints of cough, congestion, fatigue, and non-specific headaches that started approximately 2 days ago. He reports subjective fevers. Cough is minimally productive of clear sputum. He denies any shortness of breath or wheezing. Patient has not experienced any nausea, vomiting, diarrhea, or abdominal pain. He is drinking well, but notes that his appetite has been decreased overall. Patient denies any perceived alterations to his sense of smell, however he notes that things don't seem to taste right since the onset of his symptoms. 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 No one else is his home has experienced a similar symptom constellation. He has never been tested for SARS-CoV-2 (novel coronavirus) in the past per his report. Patient has not been vaccinated for influenza this season. In efforts to conservatively manage his symptoms at home, the patient notes that he has used IBU, which has helped to improve his symptoms some.   Past Medical History:  Diagnosis Date  . Anxiety   . Depression     Past Surgical History:  Procedure Laterality Date  . CHOLECYSTECTOMY N/A 06/30/2016   Procedure: LAPAROSCOPIC CHOLECYSTECTOMY WITH INTRAOPERATIVE  CHOLANGIOGRAM;  Surgeon: Abigail Miyamoto, MD;  Location: [facility] OR;  Service: General;  Laterality: N/A;  . NO PAST SURGERIES     no anesthesia ever    Family History  Problem Relation Age of Onset  . Hypertension Mother   . Hypertension Father   . Migraines Neg Hx     Social History   Tobacco Use  . Smoking status: Current Some Day Smoker  . Smokeless tobacco: Never Used  . Tobacco comment: hookah occ 3 time in last week  Substance Use Topics  . Alcohol use: No    Alcohol/week: 0.0 standard drinks  . Drug use: No    Patient Active Problem List   Diagnosis Date Noted  . Morbid obesity (HCC) 02/18/2018    Home Medications:    Current Meds  Medication Sig  . acetaZOLAMIDE (DIAMOX) 250 MG tablet TAKE 1 TABLET BY MOUTH TWICE A DAY  . meclizine (ANTIVERT) 25 MG tablet Take 25 mg by mouth every 8 (eight) hours as needed for dizziness.  Marland Kitchen omeprazole (PRILOSEC OTC) 20 MG tablet Take 20 mg by mouth daily.  . [DISCONTINUED] ondansetron (ZOFRAN) 4 MG tablet Take 1 tablet (4 mg total) by mouth every 8 (eight) hours as needed for nausea or vomiting. Or dizziness.    Allergies:   Penicillins  Review of Systems (ROS): Review of Systems  Constitutional: Positive for appetite change, fatigue and fever (subjective).  HENT: Positive for congestion. Negative for ear pain, postnasal drip, rhinorrhea, sinus pressure, sinus pain, sneezing and sore throat.        (+) dysgeusia (mild)  Eyes: Negative for pain, discharge and redness.  Respiratory: Positive for cough. Negative for chest tightness and  shortness of breath.   Cardiovascular: Negative for chest pain and palpitations.  Gastrointestinal: Negative for abdominal pain, diarrhea, nausea and vomiting.  Musculoskeletal: Negative for arthralgias, back pain, myalgias and neck pain.  Skin: Negative for color change, pallor and rash.  Neurological: Positive for headaches. Negative for dizziness, syncope and weakness.  Hematological: Negative  for adenopathy.     Vital Signs: Today's Vitals   12/21/19 1334 12/21/19 1335  BP:  133/87  Pulse:  66  Resp:  18  Temp:  98.4 F (36.9 C)  TempSrc:  Oral  SpO2:  99%  Weight: 275 lb (124.7 kg)   Height: 5\' 10"  (1.778 m)   PainSc: 3      Physical Exam: Physical Exam  Constitutional: He is oriented to person, place, and time and well-developed, well-nourished, and in no distress.  HENT:  Head: Normocephalic and atraumatic.  Right Ear: Tympanic membrane normal.  Left Ear: Tympanic membrane normal.  Nose: Mucosal edema (mild) and rhinorrhea present. No sinus tenderness.  Mouth/Throat: Uvula is midline and mucous membranes are normal. Posterior oropharyngeal erythema (mild with (+) clear PND) present. No oropharyngeal exudate or posterior oropharyngeal edema.  Eyes: Pupils are equal, round, and reactive to light.  Cardiovascular: Normal rate, regular rhythm, normal heart sounds and intact distal pulses.  Pulmonary/Chest: Effort normal and breath sounds normal.  Lymphadenopathy:    He has no cervical adenopathy.  Neurological: He is alert and oriented to person, place, and time. Gait normal.  Skin: Skin is warm and dry. No rash noted. He is not diaphoretic.  Psychiatric: Mood, memory, affect and judgment normal.  Nursing note and vitals reviewed.   Urgent Care Treatments / Results:   Orders Placed This Encounter  Procedures  . Novel Coronavirus, NAA (Hosp order, Send-out to Ref Lab; TAT 18-24 hrs    LABS: PLEASE NOTE: all labs that were ordered this encounter are listed, however only abnormal results are displayed. Labs Reviewed  NOVEL CORONAVIRUS, NAA (HOSP ORDER, SEND-OUT TO REF LAB; TAT 18-24 HRS)    EKG: -None  RADIOLOGY: No results found.  PROCEDURES: Procedures  MEDICATIONS RECEIVED THIS VISIT: Medications - No data to display  PERTINENT CLINICAL COURSE NOTES/UPDATES:   Initial Impression / Assessment and Plan / Urgent Care Course:  Pertinent labs &  imaging results that were available during my care of the patient were personally reviewed by me and considered in my medical decision making (see lab/imaging section of note for values and interpretations).  Ronald George is a 38 y.o. male who presents to [facility] today with complaints of Cough, Headache, Generalized Body Aches, and COVID exposure   Patient overall well appearing and in no acute distress today in clinic. Presenting symptoms (see HPI) and exam as documented above. He presents with symptoms associated with SARS-CoV-2 (novel coronavirus) following known direct exposure to someone who has tested positive.  Discussed typical symptom constellation. Reviewed potential for infection and need for testing. Patient amenable to being tested. SARS-CoV-2 swab collected by certified clinical staff. Discussed variable turn around times associated with testing, as swabs are being processed at [facility], and have been taking between 2-5 days to come back. He was advised to self quarantine, per [facility] DHHS guidelines, until negative results received. These measures are being implemented out of an abundance of caution to prevent transmission and spread during the current SARS-CoV-2 pandemic.  Presenting symptoms consistent with acute viral illness. Until ruled out with confirmatory lab testing, SARS-CoV-2 remains part of the differential. His  testing is pending at this time. I discussed with him that his symptoms are felt to be viral in nature, thus antibiotics would not offer him any relief or improve his symptoms any faster than conservative symptomatic management. Cough minor and patient does not feel as if he needs to have an intervention in place at this time. Discussed supportive care measures at home during acute phase of illness. Patient to rest as much as possible. He was encouraged to ensure adequate hydration (water and ORS) to prevent dehydration and electrolyte derangements. Patient may use  APAP and/or IBU on an as needed basis for pain/fever.    Current clinical condition warrants patient being out of work in order to quarantine while waiting for testing results. He was provided with the appropriate documentation to provide to his place of employment that will allow for him to RTW on 12/23/2019 with no restrictions. RTW is contingent on his SARS-CoV-2 test results being reviewed as negative.   Discussed follow up with primary care physician in 1 week for re-evaluation. I have reviewed the follow up and strict return precautions for any new or worsening symptoms. Patient is aware of symptoms that would be deemed urgent/emergent, and would thus require further evaluation either here or in the emergency department. At the time of discharge, he verbalized understanding and consent with the discharge plan as it was reviewed with him. All questions were fielded by provider and/or clinic staff prior to patient discharge.    Final Clinical Impressions / Urgent Care Diagnoses:   Final diagnoses:  Viral URI with cough  Exposure to COVID-19 virus  Encounter for laboratory testing for COVID-19 virus  Advice given about COVID-19 virus infection    New Prescriptions:  [place] Controlled Substance Registry consulted? Not Applicable  No orders of the defined types were placed in this encounter.   Recommended Follow up Care:  Patient encouraged to follow up with the following provider within the specified time frame, or sooner as dictated by the severity of his symptoms. As always, he was instructed that for any urgent/emergent care needs, he should seek care either here or in the emergency department for more immediate evaluation.  Follow-up Information    Eloisa NorthernAmin, Saad, MD In 1 week.   Specialty: Internal Medicine Why: General reassessment of symptoms if not improving Contact information: [address] [place] [address] [phone]         NOTE: This note was prepared using Dragon  dictation software along with smaller phrase technology. Despite my best ability to proofread, there is the potential that transcriptional errors may still occur from this process, and are completely unintentional.    Verlee MonteGray, Gaelle Adriance E, NP 12/23/19 [phone] [id number]

## 2020-01-13 ENCOUNTER — Ambulatory Visit: Payer: PRIVATE HEALTH INSURANCE

## 2020-01-13 ENCOUNTER — Encounter: Payer: Self-pay | Admitting: Cardiology

## 2020-01-13 ENCOUNTER — Ambulatory Visit: Payer: PRIVATE HEALTH INSURANCE | Admitting: Cardiology

## 2020-01-13 ENCOUNTER — Other Ambulatory Visit: Payer: Self-pay

## 2020-01-13 VITALS — BP 149/101 | HR 73 | Temp 97.6°F | Ht 70.5 in | Wt 283.0 lb

## 2020-01-13 DIAGNOSIS — R002 Palpitations: Secondary | ICD-10-CM

## 2020-01-13 DIAGNOSIS — R030 Elevated blood-pressure reading, without diagnosis of hypertension: Secondary | ICD-10-CM | POA: Diagnosis not present

## 2020-01-13 DIAGNOSIS — G4730 Sleep apnea, unspecified: Secondary | ICD-10-CM | POA: Diagnosis not present

## 2020-01-13 NOTE — Progress Notes (Signed)
Primary Physician:  Eloisa Northern, MD   Patient ID: Ronald George, male    DOB: 03-26-1981, 39 y.o.   MRN: [id number]  Subjective:    Chief Complaint  Patient presents with  . Palpitations  . Follow-up    HPI: Ronald George  is a 39 y.o. male  with anxiety, otherwise, no significant past medical history.   He was last seen by [place] in March 2019 for palpitations. He wore 30 day event monitor that showed occasional PAC, PVC, and 1 episode of 3 beats of SVT. Echo and stress test was recommended; however, patient did not complete the workup as his symptoms improved and he was lost to follow up.  Symptoms of fluttering sensation have recently worsened and now occurring daily. His episodes can vary in severity. Some episodes he can feel pain in between shoulder blades and also occasional burning sensation. Can happen even at rest. Episodes can last for a few seconds. He feels very fatigued after the episodes. No associated near syncope. Taking a good, cleansing deep breath seems to help.  He does walk 2-3 times a week. He has had some episodes while walking. He does smoke Hookah 2-3 times a week. He has cut back on caffeine. No alcohol. No family history of heart disease in his immediate family.  Past Medical History:  Diagnosis Date  . Anxiety   . Depression     Past Surgical History:  Procedure Laterality Date  . CHOLECYSTECTOMY N/A 06/30/2016   Procedure: LAPAROSCOPIC CHOLECYSTECTOMY WITH INTRAOPERATIVE CHOLANGIOGRAM;  Surgeon: Abigail Miyamoto, MD;  Location: [facility] OR;  Service: General;  Laterality: N/A;  . NO PAST SURGERIES     no anesthesia ever    Social History   Socioeconomic History  . Marital status: Single    Spouse name: Not on file  . Number of children: 0  . Years of education: Not on file  . Highest education level: Some college, no degree  Occupational History  . Not on file  Tobacco Use  . Smoking status: Current Some Day Smoker  . Smokeless tobacco: Never Used   . Tobacco comment: hookah occ 3 time in last week  Substance and Sexual Activity  . Alcohol use: No    Alcohol/week: 0.0 standard drinks  . Drug use: No  . Sexual activity: Not on file  Other Topics Concern  . Not on file  Social History Narrative   Lives at home alone   Right handed   Drinks 1 cup of tea daily   Social Determinants of Health   Financial Resource Strain:   . Difficulty of Paying Living Expenses: Not on file  Food Insecurity:   . Worried About Programme researcher, broadcasting/film/video in the Last Year: Not on file  . Ran Out of Food in the Last Year: Not on file  Transportation Needs:   . Lack of Transportation (Medical): Not on file  . Lack of Transportation (Non-Medical): Not on file  Physical Activity:   . Days of Exercise per Week: Not on file  . Minutes of Exercise per Session: Not on file  Stress:   . Feeling of Stress : Not on file  Social Connections:   . Frequency of Communication with Friends and Family: Not on file  . Frequency of Social Gatherings with Friends and Family: Not on file  . Attends Religious Services: Not on file  . Active Member of Clubs or Organizations: Not on file  . Attends Banker Meetings: Not  on file  . Marital Status: Not on file  Intimate Partner Violence:   . Fear of Current or Ex-Partner: Not on file  . Emotionally Abused: Not on file  . Physically Abused: Not on file  . Sexually Abused: Not on file    Review of Systems  Constitution: Negative for decreased appetite, malaise/fatigue, weight gain and weight loss.  Eyes: Negative for visual disturbance.  Cardiovascular: Positive for chest pain and palpitations. Negative for claudication, dyspnea on exertion, leg swelling, orthopnea and syncope.  Respiratory: Negative for hemoptysis and wheezing.   Endocrine: Negative for cold intolerance and heat intolerance.  Hematologic/Lymphatic: Does not bruise/bleed easily.  Skin: Negative for nail changes.  Musculoskeletal: Negative  for muscle weakness and myalgias.  Gastrointestinal: Negative for abdominal pain, change in bowel habit, nausea and vomiting.  Neurological: Negative for difficulty with concentration, dizziness, focal weakness and headaches.  Psychiatric/Behavioral: Negative for altered mental status and suicidal ideas.  All other systems reviewed and are negative.     Objective:  Blood pressure (!) 149/101, pulse 73, temperature 97.6 F (36.4 C), height 5' 10.5" (1.791 m), weight 283 lb (128.4 kg), SpO2 98 %. Body mass index is 40.03 kg/m.    Physical Exam  Constitutional: He is oriented to person, place, and time. Vital signs are normal. He appears well-developed and well-nourished.  HENT:  Head: Normocephalic and atraumatic.  Cardiovascular: Normal rate, regular rhythm, normal heart sounds and intact distal pulses.  Pulmonary/Chest: Effort normal and breath sounds normal. No accessory muscle usage. No respiratory distress.  Abdominal: Soft. Bowel sounds are normal.  Musculoskeletal:        General: Normal range of motion.     Cervical back: Normal range of motion.  Neurological: He is alert and oriented to person, place, and time.  Skin: Skin is warm and dry.  Vitals reviewed.  Radiology: No results found.  Laboratory examination:    CMP Latest Ref Rng & Units 02/17/2018 04/24/2016  Glucose 65 - 99 mg/dL 111(H) 93  BUN 6 - 20 mg/dL 12 12  Creatinine 0.61 - 1.24 mg/dL 1.13 1.14  Sodium 135 - 145 mmol/L 139 141  Potassium 3.5 - 5.1 mmol/L 3.9 3.8  Chloride 101 - 111 mmol/L 108 109  CO2 22 - 32 mmol/L 21(L) 22  Calcium 8.9 - 10.3 mg/dL 9.1 9.2  Total Protein 6.5 - 8.1 g/dL - 7.1  Total Bilirubin 0.3 - 1.2 mg/dL - 0.7  Alkaline Phos 38 - 126 U/L - 66  AST 15 - 41 U/L - 31  ALT 17 - 63 U/L - 47   CBC Latest Ref Rng & Units 02/17/2018 06/24/2016 04/24/2016  WBC 4.0 - 10.5 K/uL 6.9 6.8 8.7  Hemoglobin 13.0 - 17.0 g/dL 15.4 15.0 15.7  Hematocrit 39.0 - 52.0 % 43.5 44.1 43.9  Platelets 150 -  400 K/uL 242 234 248   Lipid Panel  No results found for: CHOL, TRIG, HDL, CHOLHDL, VLDL, LDLCALC, LDLDIRECT HEMOGLOBIN A1C No results found for: HGBA1C, MPG TSH No results for input(s): TSH in the last 8760 hours.  PRN Meds:. There are no discontinued medications. Current Meds  Medication Sig  . acetaZOLAMIDE (DIAMOX) 250 MG tablet TAKE 1 TABLET BY MOUTH TWICE A DAY  . ibuprofen (ADVIL) 200 MG tablet Take 200 mg by mouth as needed.  . meclizine (ANTIVERT) 25 MG tablet Take 25 mg by mouth every 8 (eight) hours as needed for dizziness.  Marland Kitchen omeprazole (PRILOSEC OTC) 20 MG tablet Take 20 mg by  mouth daily.    Cardiac Studies:   30-day event monitor March 2019: sinus rhythm with average heart rate of 71 bpm.  Range 44 to 142 bpm.  Rare PACs and PVCs are seen.  There was one 3 beat run of PSVT.  Patient has symptoms of flutter or skipped beat few times correlating with PAC, PVC, or short run of PSVT.  Assessment:   Palpitations - Plan: EKG 12-Lead, PCV ECHOCARDIOGRAM COMPLETE, Cardiac event monitor  Elevated blood-pressure reading without diagnosis of hypertension  Mild sleep apnea  Morbid obesity (HCC)  EKG 01/13/2020: Normal sinus rhythm at 72 bpm, normal axis, no evidence of ischemia.   Recommendations:   Patient presents for follow up for worsening symptoms of palpitation symptoms. His symptoms have increased in frequency since last evaluated by [place] in 2019 and also now has associated chest burning sensation, but not with every episode. He previously had very brief episode of PSVT on event monitor. I will place him back on 7 day event monitor for reevaluation given some changes to his symptoms and to evaluate frequency. He does have mild untreated sleep apnea and obesity; therefore, A fib should be considered although his symptoms are not really suggestive of this.  I will obtain echocardiogram to exclude any structural abnormalities. His symptoms do not appear to be angina  equivalent and he is fairly low risk for CAD. Depending upon other workup and his symptoms, may consider stress testing in the future to exclude this. I have discussed vagal maneuvers, and encouraged him to try this should he have any heart racing episodes.  His blood pressure is elevated today, but is generally fairly stable. He will start daily home monitoring and record this to bring to his next appointment for further evaluation. Would recommend dietary changes and regular exercise to help promote weight loss, which I will discuss at his next follow up. I will see him back in the next few weeks for follow up to discuss the test results, but encouraged him to contact me sooner if needed.   Toniann Fail, MSN, APRN, FNP-C [facility] Office: [phone] Fax: [phone]

## 2020-01-16 NOTE — Telephone Encounter (Signed)
Please read

## 2020-01-19 ENCOUNTER — Ambulatory Visit (INDEPENDENT_AMBULATORY_CARE_PROVIDER_SITE_OTHER): Payer: PRIVATE HEALTH INSURANCE

## 2020-01-19 ENCOUNTER — Other Ambulatory Visit: Payer: Self-pay

## 2020-01-19 DIAGNOSIS — I471 Supraventricular tachycardia: Secondary | ICD-10-CM | POA: Diagnosis not present

## 2020-01-19 DIAGNOSIS — R002 Palpitations: Secondary | ICD-10-CM | POA: Diagnosis not present

## 2020-02-16 ENCOUNTER — Ambulatory Visit: Payer: PRIVATE HEALTH INSURANCE | Admitting: Cardiology

## 2020-02-23 ENCOUNTER — Other Ambulatory Visit: Payer: Self-pay

## 2020-02-23 ENCOUNTER — Ambulatory Visit: Payer: PRIVATE HEALTH INSURANCE | Admitting: Cardiology

## 2020-02-23 ENCOUNTER — Encounter: Payer: Self-pay | Admitting: Cardiology

## 2020-02-23 VITALS — BP 156/103 | HR 81 | Temp 98.6°F | Ht 70.5 in | Wt 295.0 lb

## 2020-02-23 DIAGNOSIS — I491 Atrial premature depolarization: Secondary | ICD-10-CM

## 2020-02-23 DIAGNOSIS — G4730 Sleep apnea, unspecified: Secondary | ICD-10-CM

## 2020-02-23 DIAGNOSIS — I10 Essential (primary) hypertension: Secondary | ICD-10-CM

## 2020-02-23 MED ORDER — METOPROLOL SUCCINATE ER 25 MG PO TB24: 25.0000 mg | ORAL_TABLET | Freq: Every day | ORAL | 1 refills | Status: DC

## 2020-02-23 MED ORDER — PANTOPRAZOLE SODIUM 40 MG PO TBEC: 40.0000 mg | DELAYED_RELEASE_TABLET | Freq: Every day | ORAL | 2 refills | Status: DC

## 2020-02-23 NOTE — Progress Notes (Signed)
Primary Physician:  Eloisa Northern, MD   Patient ID: Ronald George, male    DOB: Feb 27, 1981, 39 y.o.   MRN: [id number]  Subjective:    Chief Complaint  Patient presents with  . Palpitations    follow up    HPI: Ronald George  is a 39 y.o. male  with anxiety, brief SVT on previous 30 day event monitor and rare PAC/ PVC, recently re-evaluated by [place] for increased frequency in palpitations and chest pain. He underwent echocardiogram, 30 day event monitor, and now presents for follow up.  He continues to have palpitations in the form of fluttering sensation that can occur several times a day to every other day. He has not noticed any recent chest pain.   He is requesting help with dieting.   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 He does smoke Hookah 2-3 times a week. He has cut back on caffeine. No alcohol. No family history of heart disease in his immediate family.  Past Medical History:  Diagnosis Date  . Anxiety   . Depression     Past Surgical History:  Procedure Laterality Date  . CHOLECYSTECTOMY N/A 06/30/2016   Procedure: LAPAROSCOPIC CHOLECYSTECTOMY WITH INTRAOPERATIVE CHOLANGIOGRAM;  Surgeon: Abigail Miyamoto, MD;  Location: [facility] OR;  Service: General;  Laterality: N/A;  . NO PAST SURGERIES     no anesthesia ever    Social History   Socioeconomic History  . Marital status: Single    Spouse name: Not on file  . Number of children: 0  . Years of education: Not on file  . Highest education level: Some college, no degree  Occupational History  . Not on file  Tobacco Use  . Smoking status: Current Some Day Smoker  . Smokeless tobacco: Never Used  . Tobacco comment: hookah occ 3 time in last week  Substance and Sexual Activity  . Alcohol use: No    Alcohol/week: 0.0 standard drinks  . Drug use: No  . Sexual activity: Not on file  Other Topics Concern  . Not on file  Social History Narrative   Lives at home alone   Right handed   Drinks 1 cup of tea daily   Social Determinants of Health   Financial Resource Strain:   . Difficulty of Paying Living Expenses: Not on file  Food Insecurity:   . Worried About Programme researcher, broadcasting/film/video in the Last Year: Not on file  . Ran Out of Food in the Last Year: Not on file  Transportation Needs:   . Lack of Transportation (Medical): Not on file  . Lack of Transportation (Non-Medical): Not on file  Physical Activity:   . Days of Exercise per Week: Not on file  . Minutes of Exercise per Session: Not on file  Stress:   . Feeling of Stress : Not on file  Social Connections:   . Frequency of Communication with Friends and Family: Not on file  . Frequency of Social Gatherings with Friends and Family: Not on file  . Attends Religious Services: Not on file  . Active Member of Clubs or Organizations: Not on file  . Attends Banker Meetings: Not on file  . Marital Status: Not on file  Intimate Partner Violence:   . Fear of Current or Ex-Partner: Not on file  . Emotionally Abused: Not on file  . Physically Abused: Not on file  . Sexually Abused: Not on file  Review of Systems  Constitution: Negative for decreased appetite, malaise/fatigue, weight gain and weight loss.  Eyes: Negative for visual disturbance.  Cardiovascular: Positive for palpitations. Negative for chest pain, claudication, dyspnea on exertion, leg swelling, orthopnea and syncope.  Respiratory: Negative for hemoptysis and wheezing.   Endocrine: Negative for cold intolerance and heat intolerance.  Hematologic/Lymphatic: Does not bruise/bleed easily.  Skin: Negative for nail changes.  Musculoskeletal: Negative for muscle weakness and myalgias.  Gastrointestinal: Negative for abdominal pain, change in bowel habit, nausea and vomiting.  Neurological: Negative for difficulty with concentration, dizziness, focal weakness and headaches.  Psychiatric/Behavioral: Negative for altered mental status  and suicidal ideas.  All other systems reviewed and are negative.     Objective:  Blood pressure (!) 156/103, pulse 81, temperature 98.6 F (37 C), height 5' 10.5" (1.791 m), weight 295 lb (133.8 kg), SpO2 98 %. Body mass index is 41.73 kg/m.    Physical Exam  Constitutional: He is oriented to person, place, and time. Vital signs are normal. He appears well-developed and well-nourished.  HENT:  Head: Normocephalic and atraumatic.  Cardiovascular: Normal rate, regular rhythm, normal heart sounds and intact distal pulses.  Pulmonary/Chest: Effort normal and breath sounds normal. No accessory muscle usage. No respiratory distress.  Abdominal: Soft. Bowel sounds are normal.  Musculoskeletal:        General: Normal range of motion.     Cervical back: Normal range of motion.  Neurological: He is alert and oriented to person, place, and time.  Skin: Skin is warm and dry.  Vitals reviewed.  Radiology: No results found.  Laboratory examination:    CMP Latest Ref Rng & Units 02/17/2018 04/24/2016  Glucose 65 - 99 mg/dL 111(H) 93  BUN 6 - 20 mg/dL 12 12  Creatinine 0.61 - 1.24 mg/dL 1.13 1.14  Sodium 135 - 145 mmol/L 139 141  Potassium 3.5 - 5.1 mmol/L 3.9 3.8  Chloride 101 - 111 mmol/L 108 109  CO2 22 - 32 mmol/L 21(L) 22  Calcium 8.9 - 10.3 mg/dL 9.1 9.2  Total Protein 6.5 - 8.1 g/dL - 7.1  Total Bilirubin 0.3 - 1.2 mg/dL - 0.7  Alkaline Phos 38 - 126 U/L - 66  AST 15 - 41 U/L - 31  ALT 17 - 63 U/L - 47   CBC Latest Ref Rng & Units 02/17/2018 06/24/2016 04/24/2016  WBC 4.0 - 10.5 K/uL 6.9 6.8 8.7  Hemoglobin 13.0 - 17.0 g/dL 15.4 15.0 15.7  Hematocrit 39.0 - 52.0 % 43.5 44.1 43.9  Platelets 150 - 400 K/uL 242 234 248   Lipid Panel  No results found for: CHOL, TRIG, HDL, CHOLHDL, VLDL, LDLCALC, LDLDIRECT HEMOGLOBIN A1C No results found for: HGBA1C, MPG TSH No results for input(s): TSH in the last 8760 hours.  PRN Meds:. Medications Discontinued During This Encounter    Medication Reason  . omeprazole (PRILOSEC OTC) 20 MG tablet Discontinued by provider   Current Meds  Medication Sig  . acetaZOLAMIDE (DIAMOX) 250 MG tablet TAKE 1 TABLET BY MOUTH TWICE A DAY  . ibuprofen (ADVIL) 200 MG tablet Take 200 mg by mouth as needed.  . meclizine (ANTIVERT) 25 MG tablet Take 25 mg by mouth every 8 (eight) hours as needed for dizziness.  . [DISCONTINUED] omeprazole (PRILOSEC OTC) 20 MG tablet Take 20 mg by mouth daily.    Cardiac Studies:   Echocardiogram 01/20/2020:  Left ventricle cavity is normal in size. Mild concentric hypertrophy of  the left ventricle. Normal global wall motion.  Normal LV systolic function  with EF 60%. Normal diastolic filling pattern.  No significant valvular abnormality. IVC not visualized.  Left atrial cavity is moderately dilated. Aneurysmal septum with limited  evaluation to assess for interatrial shunting. Clinical correlation  recommended.  30-day event monitor March 2019: sinus rhythm with average heart rate of 71 bpm.  Range 44 to 142 bpm.  Rare PACs and PVCs are seen.  There was one 3 beat run of PSVT.  Patient has symptoms of flutter or skipped beat few times correlating with PAC, PVC, or short run of PSVT.  Assessment:   Premature atrial contraction - Plan: Basic metabolic panel, Magnesium, TSH  Primary hypertension - Plan: Lipid Profile, CBC  Morbid obesity (HCC)  Mild sleep apnea  EKG 01/13/2020: Normal sinus rhythm at 72 bpm, normal axis, no evidence of ischemia.   Recommendations:   Patient continues to have frequent palpitations. I have reviewed and discussed his event monitor results, his symptoms of palpitations correlated with rare PAC and PVC. 1 brief episode of atrial tachycardia that was symptomatic. No A fib. As he is symptomatic, will start him on Metoprolol succinate 25 mg daily. I do suspect that he may have GERD that is contributing to his palpitations as well. Will discontinue Omeprazole and change to  Pantoprazole 40 mg daily before breakfast to see if this will also help with his symptoms. He has not had recent labs, will obtain CBC, CMP, TSH, magnesium, and lipid panel.               I have reviewed and discussed his echocardiogram results with the patient, has mild LVH and grade 1 diastolic dysfunction. Normal LVEF. His blood pressure is markedly elevated today. Suspect that patient has underlying hypertension given echo findings and markedly elevated blood pressure. Hopefully metoprolol will help with this, but may require second agent.  He has not had any further chest pain. He will continue to need risk factor modification. I have strongly encouraged him to work on his weight loss and to research available dietary programs. He is requesting help with making dietary changes, will make referral to [facility] to help him with establishing dietary weight loss plan. We will see him back in 4 weeks for follow up.   Toniann Fail, MSN, APRN, FNP-C [facility] Office: [phone] Fax: [phone]

## 2020-03-03 LAB — CBC
Hematocrit: 47.8 % (ref 37.5–51.0)
Hemoglobin: 16 g/dL (ref 13.0–17.7)
MCH: 27.7 pg (ref 26.6–33.0)
MCHC: 33.5 g/dL (ref 31.5–35.7)
MCV: 83 fL (ref 79–97)
Platelets: 251 10*3/uL (ref 150–450)
RBC: 5.78 x10E6/uL (ref 4.14–5.80)
RDW: 12.4 % (ref 11.6–15.4)
WBC: 8.4 10*3/uL (ref 3.4–10.8)

## 2020-03-03 LAB — LIPID PANEL
Chol/HDL Ratio: 3.9 ratio (ref 0.0–5.0)
Cholesterol, Total: 141 mg/dL (ref 100–199)
HDL: 36 mg/dL — ABNORMAL LOW (ref >39)
LDL Chol Calc (NIH): 83 mg/dL (ref 0–99)
Triglycerides: 121 mg/dL (ref 0–149)
VLDL Cholesterol Cal: 22 mg/dL (ref 5–40)

## 2020-03-03 LAB — BASIC METABOLIC PANEL
BUN/Creatinine Ratio: 14 (ref 9–20)
BUN: 16 mg/dL (ref 6–20)
CO2: 24 mmol/L (ref 20–29)
Calcium: 9.2 mg/dL (ref 8.7–10.2)
Chloride: 105 mmol/L (ref 96–106)
Creatinine, Ser: 1.12 mg/dL (ref 0.76–1.27)
GFR calc Af Amer: 95 mL/min/{1.73_m2} (ref >59)
GFR calc non Af Amer: 82 mL/min/{1.73_m2} (ref >59)
Glucose: 100 mg/dL — ABNORMAL HIGH (ref 65–99)
Potassium: 4.7 mmol/L (ref 3.5–5.2)
Sodium: 140 mmol/L (ref 134–144)

## 2020-03-03 LAB — MAGNESIUM: Magnesium: 1.8 mg/dL (ref 1.6–2.3)

## 2020-03-03 LAB — TSH: TSH: 2.31 u[IU]/mL (ref 0.450–4.500)

## 2020-03-22 ENCOUNTER — Ambulatory Visit: Payer: PRIVATE HEALTH INSURANCE | Admitting: Cardiology

## 2020-04-05 ENCOUNTER — Other Ambulatory Visit: Payer: Self-pay

## 2020-04-05 ENCOUNTER — Encounter: Payer: Self-pay | Admitting: Cardiology

## 2020-04-05 ENCOUNTER — Ambulatory Visit: Payer: PRIVATE HEALTH INSURANCE | Admitting: Cardiology

## 2020-04-05 VITALS — BP 133/79 | HR 76 | Temp 98.4°F | Resp 15 | Ht 70.0 in | Wt 284.0 lb

## 2020-04-05 DIAGNOSIS — I10 Essential (primary) hypertension: Secondary | ICD-10-CM

## 2020-04-05 DIAGNOSIS — G4733 Obstructive sleep apnea (adult) (pediatric): Secondary | ICD-10-CM

## 2020-04-05 DIAGNOSIS — I491 Atrial premature depolarization: Secondary | ICD-10-CM

## 2020-04-05 DIAGNOSIS — R002 Palpitations: Secondary | ICD-10-CM

## 2020-04-05 DIAGNOSIS — Z6841 Body Mass Index (BMI) 40.0 and over, adult: Secondary | ICD-10-CM

## 2020-04-05 NOTE — Progress Notes (Signed)
Ronald George Date of Birth: [birth year] MRN: [id number] Primary Care Provider:Amin, Jason Fila, MD  Date: 04/05/20 Last Office Visit: 02/23/2020  Chief Complaint  Patient presents with  . Palpitations  . Elevated blood pressures    HPI  Ronald George is a 39 y.o.  male who presents to the office with a chief complaint of " follow-up on elevated blood pressures and palpitations." Patient's past medical history and cardiovascular risk factors include: Anxiety, brief episode of SVT on a 30-day event monitor, rare PACs/PVCs, obesity.  Patient was last seen in the office on February 23, 2020 by Toniann Fail, MSN, APRN, FNP-C.  I am seeing the patient for the first time for his above-mentioned chief complaint and follow-up for palpitations.  Palpitations: Patient states that his symptoms of palpitations have improved significantly.  He is tolerating the initiation of metoprolol well he denies any symptoms of depression and or erectile dysfunction.  Patient states that he does have palpitations but they are much less in frequency and intensity.  Patient notices the palpitations can occur randomly such as after eating, while he is resting, physically active.  Patient denies any lightheadedness, dizziness, near syncope or syncope.  Patient has been diagnosed with obstructive sleep apnea but is currently not on CPAP.  Patient's blood pressures were elevated at today's office visit.  He has had multiple office visits during which his blood pressures has been elevated consistent with diagnosis of hypertension.  Patient states that his blood pressure at home is around 143/96.  Patient is recommended to be on antihypertensive medications; however, patient would like to continue with lifestyle modifications.  ALLERGIES: Allergies  Allergen Reactions  . Penicillins     Not sure on rx. Had rx as child.  Has patient had a PCN reaction causing immediate rash, facial/tongue/throat swelling, SOB or  lightheadedness with hypotension: n/a Has patient had a PCN reaction causing severe rash involving mucus membranes or skin necrosis: n/a Has patient had a PCN reaction that required hospitalization: n/a Has patient had a PCN reaction occurring within the last 10 years: n/a If all of the above answers are "NO", then may proceed with Cephalosporin use.      MEDICATION LIST PRIOR TO VISIT: Current Outpatient Medications on File Prior to Visit  Medication Sig Dispense Refill  . acetaZOLAMIDE (DIAMOX) 250 MG tablet TAKE 1 TABLET BY MOUTH TWICE A DAY 180 tablet 1  . ibuprofen (ADVIL) 200 MG tablet Take 200 mg by mouth as needed.    . meclizine (ANTIVERT) 25 MG tablet Take 25 mg by mouth every 8 (eight) hours as needed for dizziness.    . metoprolol succinate (TOPROL-XL) 25 MG 24 hr tablet Take 1 tablet (25 mg total) by mouth daily. Take with or immediately following a meal. 30 tablet 1  . pantoprazole (PROTONIX) 40 MG tablet Take 1 tablet (40 mg total) by mouth daily. 30 tablet 2  . [DISCONTINUED] propranolol ER (INDERAL LA) 60 MG 24 hr capsule Take 60 mg by mouth daily.    . [DISCONTINUED] topiramate (TOPAMAX) 25 MG capsule Take 25 mg by mouth at bedtime.      Current Facility-Administered Medications on File Prior to Visit  Medication Dose Route Frequency Provider Last Rate Last Admin  . gadopentetate dimeglumine (MAGNEVIST) injection 20 mL  20 mL Intravenous Once PRN Anson Fret, MD        PAST MEDICAL HISTORY: Past Medical History:  Diagnosis Date  . Anxiety   . Depression   .  Hypertension     PAST SURGICAL HISTORY: Past Surgical History:  Procedure Laterality Date  . CHOLECYSTECTOMY N/A 06/30/2016   Procedure: LAPAROSCOPIC CHOLECYSTECTOMY WITH INTRAOPERATIVE CHOLANGIOGRAM;  Surgeon: Abigail Miyamoto, MD;  Location: [facility] OR;  Service: General;  Laterality: N/A;  . NO PAST SURGERIES     no anesthesia ever    FAMILY HISTORY: The patient's family history includes Hypertension  in his father and mother.   SOCIAL HISTORY:  The patient  reports that he has been smoking. He has never used smokeless tobacco. He reports that he does not drink alcohol or use drugs.  Review of Systems  Constitution: Negative for chills and fever.  HENT: Negative for ear discharge, ear pain and nosebleeds.   Eyes: Negative for blurred vision and discharge.  Cardiovascular: Positive for palpitations. Negative for chest pain, claudication, dyspnea on exertion, leg swelling, near-syncope, orthopnea, paroxysmal nocturnal dyspnea and syncope.  Respiratory: Negative for cough and shortness of breath.   Endocrine: Negative for polydipsia, polyphagia and polyuria.  Hematologic/Lymphatic: Negative for bleeding problem.  Skin: Negative for flushing and nail changes.  Musculoskeletal: Negative for muscle cramps, muscle weakness and myalgias.  Gastrointestinal: Negative for abdominal pain, dysphagia, hematemesis, hematochezia, melena, nausea and vomiting.  Neurological: Negative for dizziness, focal weakness and light-headedness.    PHYSICAL EXAM: Vitals with BMI 04/05/2020 02/23/2020 01/13/2020  Height 5\' 10"  5' 10.5" 5' 10.5"  Weight 284 lbs 295 lbs 283 lbs  BMI 40.75 41.72 40.02  Systolic 133 156  Diastolic 79 103 101  Pulse 76 81 73   CONSTITUTIONAL: Well-developed and well-nourished. No acute distress.  SKIN: Skin is warm and dry. No rash noted. No cyanosis. No pallor. No jaundice HEAD: Normocephalic and atraumatic.  EYES: No scleral icterus MOUTH/THROAT: Moist oral membranes.  NECK: No JVD present. No thyromegaly noted. No carotid bruits  LYMPHATIC: No visible cervical adenopathy.  CHEST Normal respiratory effort. No intercostal retractions  LUNGS: Clear to auscultation bilaterally.  No stridor. No wheezes. No rales.  CARDIOVASCULAR: Regular rate and rhythm, positive S1-S2, no murmurs rubs or gallops appreciated. ABDOMINAL: Obese, soft, nontender, nondistended, positive bowel sounds  in all 4 quadrants.  No apparent ascites.  EXTREMITIES: Trace bilateral peripheral edema, warm to touch, 2+ dorsalis pedis and posterior tibial pulses HEMATOLOGIC: No significant bruising NEUROLOGIC: Oriented to person, place, and time. Nonfocal. Normal muscle tone.  PSYCHIATRIC: Normal mood and affect. Normal behavior. Cooperative  CARDIAC DATABASE: EKG: EKG 01/13/2020: Normal sinus rhythm at 72 bpm, normal axis, no evidence of ischemia.  Echocardiogram: 01/20/2020: LVEF 60%, mild concentric LVH, normal diastolic function, no significant valve abnormality, moderately dilated left atrium.  Aneurysmal septum with limited evaluation to assess for interatrial shunting.  Stress Testing:  None  Heart Catheterization: None  Cardiac Event Monitor: 7 day event monitor 01/15-01/21/2021: Normal sinus rhythm.  Symptoms of flutter/skipped beats correlated with PAC or PVC.  1 brief episode of atrial tachycardia at 100 bpm on Day 4 at 12:45 PM.  No A. fib was noted.  LABORATORY DATA: CBC Latest Ref Rng & Units 03/02/2020 02/17/2018 06/24/2016  WBC 3.4 - 10.8 x10E3/uL 8.4 6.9 6.8  Hemoglobin 13.0 - 17.7 g/dL 06/26/2016 37.1 06.2  Hematocrit 37.5 - 51.0 % 47.8 43.5 44.1  Platelets 150 - 450 x10E3/uL 251 242 234    CMP Latest Ref Rng & Units 03/02/2020 02/17/2018 04/24/2016  Glucose 65 - 99 mg/dL 04/26/2016) 854(O) 93  BUN 6 - 20 mg/dL 16 12 12   Creatinine 0.76 - 1.27 mg/dL 270(J  1.14  Sodium 134 - 144 mmol/L 140 139 141  Potassium 3.5 - 5.2 mmol/L 4.7 3.9 3.8  Chloride 96 - 106 mmol/L 105 108 109  CO2 20 - 29 mmol/L 24 21(L) 22  Calcium 8.7 - 10.2 mg/dL 9.2 9.1 9.2  Total Protein 6.5 - 8.1 g/dL - - 7.1  Total Bilirubin 0.3 - 1.2 mg/dL - - 0.7  Alkaline Phos 38 - 126 U/L - - 66  AST 15 - 41 U/L - - 31  ALT 17 - 63 U/L - - 47    Lipid Panel     Component Value Date/Time   CHOL 141 03/02/2020 0857   TRIG 121 03/02/2020 0857   HDL 36 (L) 03/02/2020 0857   CHOLHDL 3.9 03/02/2020 0857   LDLCALC 83  03/02/2020 0857   LABVLDL 22 03/02/2020 0857    No results found for: HGBA1C No components found for: NTPROBNP Lab Results  Component Value Date   TSH 2.310 03/02/2020   FINAL MEDICATION LIST END OF ENCOUNTER: No orders of the defined types were placed in this encounter.   There are no discontinued medications.   Current Outpatient Medications:  .  acetaZOLAMIDE (DIAMOX) 250 MG tablet, TAKE 1 TABLET BY MOUTH TWICE A DAY, Disp: 180 tablet, Rfl: 1 .  ibuprofen (ADVIL) 200 MG tablet, Take 200 mg by mouth as needed., Disp: , Rfl:  .  meclizine (ANTIVERT) 25 MG tablet, Take 25 mg by mouth every 8 (eight) hours as needed for dizziness., Disp: , Rfl:  .  metoprolol succinate (TOPROL-XL) 25 MG 24 hr tablet, Take 1 tablet (25 mg total) by mouth daily. Take with or immediately following a meal., Disp: 30 tablet, Rfl: 1 .  pantoprazole (PROTONIX) 40 MG tablet, Take 1 tablet (40 mg total) by mouth daily., Disp: 30 tablet, Rfl: 2 No current facility-administered medications for this visit.  Facility-Administered Medications Ordered in Other Visits:  .  gadopentetate dimeglumine (MAGNEVIST) injection 20 mL, 20 mL, Intravenous, Once PRN, Anson Fret, MD  IMPRESSION:    ICD-10-CM   1. Palpitations  R00.2   2. Premature atrial contraction  I49.1   3. Benign hypertension  I10   4. Class 3 severe obesity due to excess calories without serious comorbidity with body mass index (BMI) of 40.0 to 44.9 in adult (HCC)  E66.01    Z68.41   5. OSA (obstructive sleep apnea)  G47.33      RECOMMENDATIONS: Raymund Manrique is a 38 y.o. male whose past medical history and cardiovascular risk factors include: Anxiety, brief episode of SVT on a 30-day event monitor, rare PACs/PVCs, obesity.  Palpitations:  Patient states that his symptoms of palpitation have improved after the initiation of metoprolol succinate 25 mg p.o. daily.  Would recommend serial follow-up and to continue nonpharmacological therapy  as it has helped improve his symptoms overall.  Patient symptoms have also helped since he has been transitioned from omeprazole to Protonix and given his body habitus there may be a component GERD.  Patient is asked to follow-up with his primary care physician for further evaluation of GERD.  Benign essential hypertension:  Patient has had multiple office visits during which his blood pressures are consistent with the diagnosis of hypertension.  For now patient does not want to be on pharmacological therapy.  I have asked him to invest in a blood pressure cuff and to bring in his blood pressure log at the next office visit.  Patient is also educated on the importance  of lifestyle modifications and increasing physical activity to 30 minutes a day 5 days a week.  I have stressed the importance of better blood pressure management given the fact that his echocardiogram shows no evidence of left ventricular hypertrophy and dilated left atrium.  Low salt diet recommended. A diet that is rich in fruits, vegetables, legumes, and low-fat dairy products and low in snacks, sweets, and meats (such as the Dietary Approaches to Stop Hypertension [DASH] diet).   Obstructive sleep apnea not on CPAP: Patient is encouraged to follow-up with his sleep medicine physician to be considered for CPAP since he has been diagnosed with mild sleep apnea.  Obesity, due to excess calories: Body mass index is 40.75 kg/m. . I reviewed with the patient the importance of diet, regular physical activity/exercise, weight loss.   . Patient is educated on increasing physical activity gradually as tolerated.  With the goal of moderate intensity exercise for 30 minutes a day 5 days a week.  No orders of the defined types were placed in this encounter.  --Continue cardiac medications as reconciled in final medication list. --Return in about 4 weeks (around 05/03/2020) for follow up HTN and palpitation. . Or sooner if  needed. --Continue follow-up with your primary care physician regarding the management of your other chronic comorbid conditions.  Patient's questions and concerns were addressed to his satisfaction. He voices understanding of the instructions provided during this encounter.   During this visit I reviewed and updated: Tobacco history  allergies medication reconciliation  medical history  surgical history  family history  social history.  This note was created using a voice recognition software as a result there may be grammatical errors inadvertently enclosed that do not reflect the nature of this encounter. Every attempt is made to correct such errors.  Rex Kras, [place], [facility]  Pager: [phone] Office: [phone]

## 2020-04-22 ENCOUNTER — Other Ambulatory Visit: Payer: Self-pay | Admitting: Cardiology

## 2020-05-03 ENCOUNTER — Ambulatory Visit: Payer: PRIVATE HEALTH INSURANCE | Admitting: Cardiology

## 2020-05-03 ENCOUNTER — Other Ambulatory Visit: Payer: Self-pay

## 2020-05-03 ENCOUNTER — Encounter: Payer: Self-pay | Admitting: Cardiology

## 2020-05-03 VITALS — BP 149/96 | HR 75 | Temp 98.4°F | Ht 70.0 in | Wt 288.0 lb

## 2020-05-03 DIAGNOSIS — I491 Atrial premature depolarization: Secondary | ICD-10-CM

## 2020-05-03 DIAGNOSIS — G4733 Obstructive sleep apnea (adult) (pediatric): Secondary | ICD-10-CM

## 2020-05-03 DIAGNOSIS — R002 Palpitations: Secondary | ICD-10-CM

## 2020-05-03 DIAGNOSIS — I10 Essential (primary) hypertension: Secondary | ICD-10-CM

## 2020-05-03 NOTE — Progress Notes (Signed)
Ronald George Date of Birth: 09-17-1981 MRN: [id number] Primary Care Provider:Amin, Marlene Lard, MD Former Cardiology Providers: Jeri Lager, APRN, FNP-C Primary Cardiologist: Rex Kras, [facility] (established care 04/05/2020)  Date: 05/03/20 Last Office Visit: 04/05/2020  Chief Complaint  Patient presents with  . Palpitations    follow up  . Hypertension   HPI  Ronald George is a 39 y.o.  male who presents to the office with a chief complaint of " follow-up on elevated blood pressures and palpitations." Patient's past medical history and cardiovascular risk factors include: Anxiety, brief episode of SVT on a 30-day event monitor, rare PACs/PVCs, obesity due to excess calories, hypertension.  Patient was previously in the care of Jeri Lager, APRN, FNP-C and reestablished care with myself back in April 2021.  At the last office visit patient was doing well from his palpitation standpoint and continued to be on pharmacological therapy.  Since last office visit his palpitations have been very well controlled with very rare episodes per patient.  At the last office visit his blood pressures were elevated clinical suspicion for underlying benign essential hypertension was high.  However patient did not want to start pharmacological therapy.  Recommended that he keep a log of his blood pressures and to bring it in at today's office visit for further review.  Patient's blood pressure log illustrates that his systolic blood pressures ranges between 660-630 mmHg and diastolic blood pressures are between 80-92 mmHg.  Patient does have underlying hypertension given multiple blood pressure readings in the outpatient setting.  He is educated on importance of a low-salt diet and considering the addition of pharmacological therapy.  However, patient remains hesitant and wants to try lifestyle modifications.  Patient states that since last office visit he has increase his physical activity to 3 times a week  during which time he walks a mile each time.  Reemphasized the importance of using his CPAP given his underlying OSA.  ALLERGIES: Allergies  Allergen Reactions  . Penicillins     Not sure on rx. Had rx as child.  Has patient had a PCN reaction causing immediate rash, facial/tongue/throat swelling, SOB or lightheadedness with hypotension: n/a Has patient had a PCN reaction causing severe rash involving mucus membranes or skin necrosis: n/a Has patient had a PCN reaction that required hospitalization: n/a Has patient had a PCN reaction occurring within the last 10 years: n/a If all of the above answers are "NO", then may proceed with Cephalosporin use.      MEDICATION LIST PRIOR TO VISIT: Current Outpatient Medications on File Prior to Visit  Medication Sig Dispense Refill  . acetaZOLAMIDE (DIAMOX) 250 MG tablet TAKE 1 TABLET BY MOUTH TWICE A DAY 180 tablet 1  . ibuprofen (ADVIL) 200 MG tablet Take 200 mg by mouth as needed.    . meclizine (ANTIVERT) 25 MG tablet Take 25 mg by mouth every 8 (eight) hours as needed for dizziness.    . metoprolol succinate (TOPROL-XL) 25 MG 24 hr tablet TAKE 1 TABLET(25 MG) BY MOUTH DAILY WITH OR IMMEDIATELY FOLLOWING A MEAL 30 tablet 1  . pantoprazole (PROTONIX) 40 MG tablet Take 1 tablet (40 mg total) by mouth daily. 30 tablet 2  . [DISCONTINUED] propranolol ER (INDERAL LA) 60 MG 24 hr capsule Take 60 mg by mouth daily.    . [DISCONTINUED] topiramate (TOPAMAX) 25 MG capsule Take 25 mg by mouth at bedtime.      Current Facility-Administered Medications on File Prior to Visit  Medication Dose Route Frequency Provider  Last Rate Last Admin  . gadopentetate dimeglumine (MAGNEVIST) injection 20 mL  20 mL Intravenous Once PRN Anson Fret, MD        PAST MEDICAL HISTORY: Past Medical History:  Diagnosis Date  . Anxiety   . Depression   . Hypertension     PAST SURGICAL HISTORY: Past Surgical History:  Procedure Laterality Date  .  CHOLECYSTECTOMY N/A 06/30/2016   Procedure: LAPAROSCOPIC CHOLECYSTECTOMY WITH INTRAOPERATIVE CHOLANGIOGRAM;  Surgeon: Abigail Miyamoto, MD;  Location: [facility] OR;  Service: General;  Laterality: N/A;  . NO PAST SURGERIES     no anesthesia ever    FAMILY HISTORY: The patient's family history includes Hypertension in his father and mother.   SOCIAL HISTORY:  The patient  reports that he has been smoking. He has never used smokeless tobacco. He reports that he does not drink alcohol or use drugs.  Review of Systems  Constitution: Negative for chills and fever.  HENT: Negative for ear discharge, ear pain and nosebleeds.   Eyes: Negative for blurred vision and discharge.  Cardiovascular: Positive for palpitations. Negative for chest pain, claudication, dyspnea on exertion, leg swelling, near-syncope, orthopnea, paroxysmal nocturnal dyspnea and syncope.  Respiratory: Negative for cough and shortness of breath.   Endocrine: Negative for polydipsia, polyphagia and polyuria.  Hematologic/Lymphatic: Negative for bleeding problem.  Skin: Negative for flushing and nail changes.  Musculoskeletal: Negative for muscle cramps, muscle weakness and myalgias.  Gastrointestinal: Negative for abdominal pain, dysphagia, hematemesis, hematochezia, melena, nausea and vomiting.  Neurological: Negative for dizziness, focal weakness and light-headedness.    PHYSICAL EXAM: Vitals with BMI 05/03/2020 05/03/2020 04/05/2020  Height - 5\' 10"  5\' 10"   Weight - 288 lbs 284 lbs  BMI - 41.32 40.75  Systolic 149 153  Diastolic 96 99 79  Pulse 75 77 76   CONSTITUTIONAL: Well-developed and well-nourished. No acute distress.  SKIN: Skin is warm and dry. No rash noted. No cyanosis. No pallor. No jaundice HEAD: Normocephalic and atraumatic.  EYES: No scleral icterus MOUTH/THROAT: Moist oral membranes.  NECK: No JVD present. No thyromegaly noted. No carotid bruits  LYMPHATIC: No visible cervical adenopathy.  CHEST Normal  respiratory effort. No intercostal retractions  LUNGS: Clear to auscultation bilaterally.  No stridor. No wheezes. No rales.  CARDIOVASCULAR: Regular rate and rhythm, positive S1-S2, no murmurs rubs or gallops appreciated. ABDOMINAL: Obese, soft, nontender, nondistended, positive bowel sounds in all 4 quadrants.  No apparent ascites.  EXTREMITIES: Trace bilateral peripheral edema, warm to touch, 2+ dorsalis pedis and posterior tibial pulses HEMATOLOGIC: No significant bruising NEUROLOGIC: Oriented to person, place, and time. Nonfocal. Normal muscle tone.  PSYCHIATRIC: Normal mood and affect. Normal behavior. Cooperative  CARDIAC DATABASE: EKG: EKG 01/13/2020: Normal sinus rhythm at 72 bpm, normal axis, no evidence of ischemia.  Echocardiogram: 01/20/2020: LVEF 60%, mild concentric LVH, normal diastolic function, no significant valve abnormality, moderately dilated left atrium.  Aneurysmal septum with limited evaluation to assess for interatrial shunting.  Stress Testing:  None  Heart Catheterization: None  Cardiac Event Monitor: 7 day event monitor 01/15-01/21/2021: Normal sinus rhythm.  Symptoms of flutter/skipped beats correlated with PAC or PVC.  1 brief episode of atrial tachycardia at 100 bpm on Day 4 at 12:45 PM.  No A. fib was noted.  LABORATORY DATA: CBC Latest Ref Rng & Units 03/02/2020 02/17/2018 06/24/2016  WBC 3.4 - 10.8 x10E3/uL 8.4 6.9 6.8  Hemoglobin 13.0 - 17.7 g/dL 02/19/2018 06/26/2016 25.8  Hematocrit 37.5 - 51.0 % 47.8 43.5 44.1  Platelets 150 - 450 x10E3/uL 251 242 234    CMP Latest Ref Rng & Units 03/02/2020 02/17/2018 04/24/2016  Glucose 65 - 99 mg/dL 425(Z) 563(O) 93  BUN 6 - 20 mg/dL 16 12 12   Creatinine 0.76 - 1.27 mg/dL 7.56 4.33  Sodium 134 - 144 mmol/L 140 139 141  Potassium 3.5 - 5.2 mmol/L 4.7 3.9 3.8  Chloride 96 - 106 mmol/L 105 108 109  CO2 20 - 29 mmol/L 24 21(L) 22  Calcium 8.7 - 10.2 mg/dL 9.2 9.1 9.2  Total Protein 6.5 - 8.1 g/dL - - 7.1  Total  Bilirubin 0.3 - 1.2 mg/dL - - 0.7  Alkaline Phos 38 - 126 U/L - - 66  AST 15 - 41 U/L - - 31  ALT 17 - 63 U/L - - 47    Lipid Panel     Component Value Date/Time   CHOL 141 03/02/2020 0857   TRIG 121 03/02/2020 0857   HDL 36 (L) 03/02/2020 0857   CHOLHDL 3.9 03/02/2020 0857   LDLCALC 83 03/02/2020 0857   LABVLDL 22 03/02/2020 0857    No results found for: HGBA1C No components found for: NTPROBNP Lab Results  Component Value Date   TSH 2.310 03/02/2020   FINAL MEDICATION LIST END OF ENCOUNTER: No orders of the defined types were placed in this encounter.   There are no discontinued medications.   Current Outpatient Medications:  .  acetaZOLAMIDE (DIAMOX) 250 MG tablet, TAKE 1 TABLET BY MOUTH TWICE A DAY, Disp: 180 tablet, Rfl: 1 .  ibuprofen (ADVIL) 200 MG tablet, Take 200 mg by mouth as needed., Disp: , Rfl:  .  meclizine (ANTIVERT) 25 MG tablet, Take 25 mg by mouth every 8 (eight) hours as needed for dizziness., Disp: , Rfl:  .  metoprolol succinate (TOPROL-XL) 25 MG 24 hr tablet, TAKE 1 TABLET(25 MG) BY MOUTH DAILY WITH OR IMMEDIATELY FOLLOWING A MEAL, Disp: 30 tablet, Rfl: 1 .  pantoprazole (PROTONIX) 40 MG tablet, Take 1 tablet (40 mg total) by mouth daily., Disp: 30 tablet, Rfl: 2 No current facility-administered medications for this visit.  Facility-Administered Medications Ordered in Other Visits:  .  gadopentetate dimeglumine (MAGNEVIST) injection 20 mL, 20 mL, Intravenous, Once PRN, 05/02/2020, MD  IMPRESSION:    ICD-10-CM   1. Palpitations  R00.2   2. Premature atrial contraction  I49.1   3. Benign hypertension  I10   4. Class 3 severe obesity due to excess calories without serious comorbidity with body mass index (BMI) of 40.0 to 44.9 in adult (HCC)  E66.01    Z68.41   5. OSA (obstructive sleep apnea)  G47.33      RECOMMENDATIONS: Nikash Mortensen is a 39 y.o. male whose past medical history and cardiovascular risk factors include: Anxiety, brief  episode of SVT on a 30-day event monitor, rare PACs/PVCs, obesity, hypertension, OSA not CPAP.  Palpitations:  Patient states that his symptoms of palpitation have improved after the initiation of metoprolol succinate 25 mg p.o. daily.  Patient symptoms have also helped since he has been transitioned from omeprazole to Protonix.  Patient is asked to follow-up with his primary care physician for further evaluation of GERD.  Benign essential hypertension:  Based on his blood pressure log that he brings in at today's visit he does have benign essential hypertension.  Recommended both lifestyle modifications and pharmacological therapy.  However patient remains hesitant.    Since last office visit he has increased his physical activity  by walking at least 1 mile 3 times a week.    Given the underlying hypertension and obstructive sleep apnea recommended the importance of being compliant with CPAP.  Patient does not want to use his CPAP and therefore recommended that he discuss it further with his primary care provider and sleep medicine for different appliance or alternative.  Patient is aware that untreated sleep apnea has been associated with multiple other medical conditions such as but not including hypertension, chronic kidney disease, onset of atrial fibrillation.  He understands that  his echocardiogram shows evidence of left ventricular hypertrophy and dilated left atrium.  Low salt diet recommended. A diet that is rich in fruits, vegetables, legumes, and low-fat dairy products and low in snacks, sweets, and meats (such as the Dietary Approaches to Stop Hypertension [DASH] diet).   Obstructive sleep apnea not on CPAP: Patient is encouraged to follow-up with his sleep medicine physician to be considered for CPAP or an alternative since he has been diagnosed with sleep apnea.  Obesity, due to excess calories: Body mass index is 41.32 kg/m. . I reviewed with the patient the importance  of diet, regular physical activity/exercise, weight loss.   . Patient is educated on increasing physical activity gradually as tolerated.  With the goal of moderate intensity exercise for 30 minutes a day 5 days a week.  No orders of the defined types were placed in this encounter.  Total encounter time 35 minutes. *Total Encounter Time as defined by the Centers for Medicare and Medicaid Services includes, in addition to the face-to-face time of a patient visit (documented in the note above) non-face-to-face time: obtaining and reviewing outside history, ordering and reviewing medications, tests or procedures, care coordination (communications with other health care professionals or caregivers) and documentation in the medical record.  --Continue cardiac medications as reconciled in final medication list. --Return in about 6 months (around 11/03/2020). Or sooner if needed. --Continue follow-up with your primary care physician regarding the management of your other chronic comorbid conditions.  Patient's questions and concerns were addressed to his satisfaction. He voices understanding of the instructions provided during this encounter.   During this visit I reviewed and updated: Tobacco history  allergies medication reconciliation  medical history  surgical history  family history  social history.  This note was created using a voice recognition software as a result there may be grammatical errors inadvertently enclosed that do not reflect the nature of this encounter. Every attempt is made to correct such errors.  Tessa Lerner, [place], [facility]  Pager: [phone] Office: [phone]

## 2020-05-20 ENCOUNTER — Other Ambulatory Visit: Payer: Self-pay | Admitting: Cardiology

## 2020-06-17 ENCOUNTER — Other Ambulatory Visit: Payer: Self-pay | Admitting: Cardiology

## 2020-08-12 ENCOUNTER — Other Ambulatory Visit: Payer: Self-pay | Admitting: Cardiology

## 2020-11-07 ENCOUNTER — Ambulatory Visit: Payer: PRIVATE HEALTH INSURANCE | Admitting: Cardiology

## 2020-11-15 ENCOUNTER — Ambulatory Visit: Payer: PRIVATE HEALTH INSURANCE | Admitting: Cardiology

## 2021-02-03 ENCOUNTER — Other Ambulatory Visit: Payer: Self-pay | Admitting: Cardiology

## 2021-02-14 ENCOUNTER — Other Ambulatory Visit: Payer: Self-pay

## 2021-02-14 ENCOUNTER — Encounter: Payer: Self-pay | Admitting: Cardiology

## 2021-02-14 ENCOUNTER — Ambulatory Visit: Payer: PRIVATE HEALTH INSURANCE | Admitting: Cardiology

## 2021-02-14 VITALS — BP 139/92 | HR 78 | Temp 97.6°F | Resp 16 | Ht 70.0 in | Wt 303.0 lb

## 2021-02-14 DIAGNOSIS — G4733 Obstructive sleep apnea (adult) (pediatric): Secondary | ICD-10-CM

## 2021-02-14 DIAGNOSIS — I10 Essential (primary) hypertension: Secondary | ICD-10-CM

## 2021-02-14 DIAGNOSIS — I491 Atrial premature depolarization: Secondary | ICD-10-CM

## 2021-02-14 DIAGNOSIS — Z6841 Body Mass Index (BMI) 40.0 and over, adult: Secondary | ICD-10-CM

## 2021-02-14 DIAGNOSIS — R002 Palpitations: Secondary | ICD-10-CM

## 2021-02-14 MED ORDER — AMLODIPINE BESYLATE 10 MG PO TABS: 10.0000 mg | ORAL_TABLET | Freq: Every morning | ORAL | 0 refills | Status: DC

## 2021-02-14 MED ORDER — METOPROLOL SUCCINATE ER 25 MG PO TB24: 25.0000 mg | ORAL_TABLET | Freq: Every day | ORAL | 0 refills | Status: DC

## 2021-02-14 NOTE — Progress Notes (Signed)
Ronald George Date of Birth: January 02, 1981 MRN: [id number] Primary Care Provider:Amin, Jason FilaSaad, MD Former Cardiology Providers: Altamese CarolinaAshton Kelley, APRN, FNP-C Primary Cardiologist: Tessa LernerSunit Lilo [place], [facility],FACC (established care 04/05/2020)  Date: 02/14/21 Last Office Visit: 05/03/2020  Chief Complaint  Patient presents with  . Palpitations  . Follow-up  . Hypertension   HPI  Ronald QuintBrandon Babula is a 40 y.o.  male who presents to the office with a chief complaint of " 9 month follow-up on blood pressure and palpitations." Patient's past medical history and cardiovascular risk factors include: Anxiety, brief episode of SVT on a 30-day event monitor, rare PACs/PVCs, obesity due to excess calories, hypertension.  Patient was previously in the care of Altamese CarolinaAshton Kelley, APRN, FNP-C and reestablished care with myself back in April 2021.   With regards to palpitations patient states that his symptoms are very well controlled on the current dose of Toprol-XL.  He is requesting a refill.  Since last office visit unfortunately he has gained about 15 pounds.  He states that he is eating a well-balanced diet tries to walk at least 30 minutes a day.  He is attributing the weight gain to stress at work and at times dietary indiscretion.  In addition, he smokes hookah at least once a week.  He checks his blood pressure at home and systolic numbers are between 130-145 mmHg.  He still resistant on using his CPAP due to the mask not fitting appropriately.  ALLERGIES: Allergies  Allergen Reactions  . Penicillins     Not sure on rx. Had rx as child.  Has patient had a PCN reaction causing immediate rash, facial/tongue/throat swelling, SOB or lightheadedness with hypotension: n/a Has patient had a PCN reaction causing severe rash involving mucus membranes or skin necrosis: n/a Has patient had a PCN reaction that required hospitalization: n/a Has patient had a PCN reaction occurring within the last 10 years: n/a If all of the  above answers are "NO", then may proceed with Cephalosporin use.      MEDICATION LIST PRIOR TO VISIT: Current Outpatient Medications on File Prior to Visit  Medication Sig Dispense Refill  . allopurinol (ZYLOPRIM) 100 MG tablet Take 100 mg by mouth daily.    Marland Kitchen. ibuprofen (ADVIL) 200 MG tablet Take 200 mg by mouth as needed.    . pantoprazole (PROTONIX) 40 MG tablet TAKE 1 TABLET(40 MG) BY MOUTH DAILY 30 tablet 6  . [DISCONTINUED] propranolol ER (INDERAL LA) 60 MG 24 hr capsule Take 60 mg by mouth daily.    . [DISCONTINUED] topiramate (TOPAMAX) 25 MG capsule Take 25 mg by mouth at bedtime.      Current Facility-Administered Medications on File Prior to Visit  Medication Dose Route Frequency Provider Last Rate Last Admin  . gadopentetate dimeglumine (MAGNEVIST) injection 20 mL  20 mL Intravenous Once PRN Anson FretAhern, Antonia B, MD        PAST MEDICAL HISTORY: Past Medical History:  Diagnosis Date  . Anxiety   . Depression   . Hypertension     PAST SURGICAL HISTORY: Past Surgical History:  Procedure Laterality Date  . CHOLECYSTECTOMY N/A 06/30/2016   Procedure: LAPAROSCOPIC CHOLECYSTECTOMY WITH INTRAOPERATIVE CHOLANGIOGRAM;  Surgeon: Abigail Miyamotoouglas Blackman, MD;  Location: [facility] OR;  Service: General;  Laterality: N/A;  . NO PAST SURGERIES     no anesthesia ever    FAMILY HISTORY: The patient's family history includes Hypertension in his father and mother.   SOCIAL HISTORY:  The patient  reports that he has been smoking. He has never used  smokeless tobacco. He reports that he does not drink alcohol and does not use drugs.  Review of Systems  Constitutional: Negative for chills and fever.  HENT: Negative for ear discharge, ear pain and nosebleeds.   Eyes: Negative for blurred vision and discharge.  Cardiovascular: Positive for palpitations. Negative for chest pain, claudication, dyspnea on exertion, leg swelling, near-syncope, orthopnea, paroxysmal nocturnal dyspnea and syncope.  Respiratory:  Negative for cough and shortness of breath.   Endocrine: Negative for polydipsia, polyphagia and polyuria.  Hematologic/Lymphatic: Negative for bleeding problem.  Skin: Negative for flushing and nail changes.  Musculoskeletal: Negative for muscle cramps, muscle weakness and myalgias.  Gastrointestinal: Negative for abdominal pain, dysphagia, hematemesis, hematochezia, melena, nausea and vomiting.  Neurological: Negative for dizziness, focal weakness and light-headedness.    PHYSICAL EXAM: Vitals with BMI 02/14/2021 02/14/2021 05/03/2020  Height - 5\' 10"  -  Weight - 303 lbs -  BMI - 43.48 -  Systolic 139 170  Diastolic 92 100 96  Pulse 78 83 75   CONSTITUTIONAL: Well-developed and well-nourished. No acute distress.  SKIN: Skin is warm and dry. No rash noted. No cyanosis. No pallor. No jaundice HEAD: Normocephalic and atraumatic.  EYES: No scleral icterus MOUTH/THROAT: Moist oral membranes.  NECK: No JVD present. No thyromegaly noted. No carotid bruits  LYMPHATIC: No visible cervical adenopathy.  CHEST Normal respiratory effort. No intercostal retractions  LUNGS: Clear to auscultation bilaterally.  No stridor. No wheezes. No rales.  CARDIOVASCULAR: Regular rate and rhythm, positive S1-S2, no murmurs rubs or gallops appreciated. ABDOMINAL: Obese, soft, nontender, nondistended, positive bowel sounds in all 4 quadrants.  No apparent ascites.  EXTREMITIES: Trace bilateral peripheral edema, warm to touch, 2+ dorsalis pedis and posterior tibial pulses HEMATOLOGIC: No significant bruising NEUROLOGIC: Oriented to person, place, and time. Nonfocal. Normal muscle tone.  PSYCHIATRIC: Normal mood and affect. Normal behavior. Cooperative  CARDIAC DATABASE: EKG: 02/14/2021: Normal sinus rhythm, 77 bpm, without underlying injury pattern.  Echocardiogram: 01/20/2020: LVEF 60%, mild concentric LVH, normal diastolic function, no significant valve abnormality, moderately dilated left atrium.   Aneurysmal septum with limited evaluation to assess for interatrial shunting.  Stress Testing:  None  Heart Catheterization: None  Cardiac Event Monitor: 7 day event monitor 01/15-01/21/2021: Normal sinus rhythm.  Symptoms of flutter/skipped beats correlated with PAC or PVC.  1 brief episode of atrial tachycardia at 100 bpm on Day 4 at 12:45 PM.  No A. fib was noted.  LABORATORY DATA: CBC Latest Ref Rng & Units 03/02/2020 02/17/2018 06/24/2016  WBC 3.4 - 10.8 x10E3/uL 8.4 6.9 6.8  Hemoglobin 13.0 - 17.7 g/dL 06/26/2016 15.1 76.1  Hematocrit 37.5 - 51.0 % 47.8 43.5 44.1  Platelets 150 - 450 x10E3/uL 251 242 234    CMP Latest Ref Rng & Units 03/02/2020 02/17/2018 04/24/2016  Glucose 65 - 99 mg/dL 04/26/2016) 371(G) 93  BUN 6 - 20 mg/dL 16 12 12   Creatinine 0.76 - 1.27 mg/dL 626(R 4.85  Sodium 134 - 144 mmol/L 140 139 141  Potassium 3.5 - 5.2 mmol/L 4.7 3.9 3.8  Chloride 96 - 106 mmol/L 105 108 109  CO2 20 - 29 mmol/L 24 21(L) 22  Calcium 8.7 - 10.2 mg/dL 9.2 9.1 9.2  Total Protein 6.5 - 8.1 g/dL - - 7.1  Total Bilirubin 0.3 - 1.2 mg/dL - - 0.7  Alkaline Phos 38 - 126 U/L - - 66  AST 15 - 41 U/L - - 31  ALT 17 - 63 U/L - - 47  Lipid Panel     Component Value Date/Time   CHOL 141 03/02/2020 0857   TRIG 121 03/02/2020 0857   HDL 36 (L) 03/02/2020 0857   CHOLHDL 3.9 03/02/2020 0857   LDLCALC 83 03/02/2020 0857   LABVLDL 22 03/02/2020 0857    No results found for: HGBA1C No components found for: NTPROBNP Lab Results  Component Value Date   TSH 2.310 03/02/2020   FINAL MEDICATION LIST END OF ENCOUNTER: Meds ordered this encounter  Medications  . amLODipine (NORVASC) 10 MG tablet    Sig: Take 1 tablet (10 mg total) by mouth in the morning.    Dispense:  90 tablet    Refill:  0  . metoprolol succinate (TOPROL-XL) 25 MG 24 hr tablet    Sig: Take 1 tablet (25 mg total) by mouth daily.    Dispense:  90 tablet    Refill:  0    Medications Discontinued During This Encounter   Medication Reason  . acetaZOLAMIDE (DIAMOX) 250 MG tablet Error  . meclizine (ANTIVERT) 25 MG tablet Error  . metoprolol succinate (TOPROL-XL) 25 MG 24 hr tablet Reorder     Current Outpatient Medications:  .  allopurinol (ZYLOPRIM) 100 MG tablet, Take 100 mg by mouth daily., Disp: , Rfl:  .  amLODipine (NORVASC) 10 MG tablet, Take 1 tablet (10 mg total) by mouth in the morning., Disp: 90 tablet, Rfl: 0 .  ibuprofen (ADVIL) 200 MG tablet, Take 200 mg by mouth as needed., Disp: , Rfl:  .  pantoprazole (PROTONIX) 40 MG tablet, TAKE 1 TABLET(40 MG) BY MOUTH DAILY, Disp: 30 tablet, Rfl: 6 .  metoprolol succinate (TOPROL-XL) 25 MG 24 hr tablet, Take 1 tablet (25 mg total) by mouth daily., Disp: 90 tablet, Rfl: 0 No current facility-administered medications for this visit.  Facility-Administered Medications Ordered in Other Visits:  .  gadopentetate dimeglumine (MAGNEVIST) injection 20 mL, 20 mL, Intravenous, Once PRN, Anson Fret, MD  IMPRESSION:    ICD-10-CM   1. Benign hypertension  I10 amLODipine (NORVASC) 10 MG tablet  2. Palpitations  R00.2 EKG 12-Lead    metoprolol succinate (TOPROL-XL) 25 MG 24 hr tablet  3. Premature atrial contraction  I49.1 metoprolol succinate (TOPROL-XL) 25 MG 24 hr tablet  4. OSA (obstructive sleep apnea)  G47.33   5. Class 3 severe obesity due to excess calories without serious comorbidity with body mass index (BMI) of 40.0 to 44.9 in adult [facility])  E66.01    Z68.41      RECOMMENDATIONS: Larron Armor is a 40 y.o. male whose past medical history and cardiovascular risk factors include: Anxiety, brief episode of SVT on a 30-day event monitor, rare PACs/PVCs, obesity, hypertension, OSA not CPAP.  Essential hypertension:  Patient is here for 25-month follow-up.  Since then he has gained approximately 15 pounds, smokes hookah at least once a day at home blood pressure readings are between 130-145 mmHg.  Patient is willing to start pharmacological therapy  as he is concerned about elevated blood pressures.  He already has findings of LVH and dilated left atrium prior echocardiograms.  Start amlodipine 10 mg p.o. daily.  Patient is currently in the process of transitioning jobs and will not have insurance in the next 60 days and therefore he is not started on losartan or hydrochlorothiazide at this time as both of them will require follow-up blood work.  Low-salt diet recommended.  Patient is asked to call the office if his blood pressures are consistently greater than 140  mmHg.  He is currently not diabetic and would recommend a goal systolic blood pressure of around 120 mmHg.  Smoking:  Patient currently smokes at least hookah once a week if not more.  Patient is educated on the importance of smoking cessation.  Reviewed his estimated 10-year risk of ASCVD during today's office visit.  His current score is approximately 3% and if he stopped smoking patient understands and his score would be closer to 1%.  The ideal score given his age and risk factors should be approximately 0.6%.  Tobacco cessation counseling:  Currently smoking hookah  Patient was informed of the dangers of tobacco abuse including stroke, cancer, and MI, as well as benefits of tobacco cessation.  Patient is willing to quit at this time.  Approximately 7 mins were spent counseling patient cessation techniques. We discussed various methods to help quit smoking.   I will reassess his progress at the next follow-up visit  Palpitations: Stable  Continue Toprol-XL 25 mg p.o. daily.  Prescription refilled.    Obstructive sleep apnea not on CPAP: Patient is encouraged to follow-up with his sleep medicine physician to be considered for CPAP or an alternative since he has been diagnosed with sleep apnea.  Obesity, due to excess calories: Body mass index is 43.48 kg/m. . I reviewed with the patient the importance of diet, regular physical activity/exercise, weight  loss.   . Patient is educated on increasing physical activity gradually as tolerated.  With the goal of moderate intensity exercise for 30 minutes a day 5 days a week.  Orders Placed This Encounter  Procedures  . EKG 12-Lead   Total encounter time 35 minutes. *Total Encounter Time as defined by the Centers for Medicare and Medicaid Services includes, in addition to the face-to-face time of a patient visit (documented in the note above) non-face-to-face time: obtaining and reviewing outside history, ordering and reviewing medications, tests or procedures, care coordination (communications with other health care professionals or caregivers) and documentation in the medical record.  --Continue cardiac medications as reconciled in final medication list. --Return in about 2 months (around 04/20/2021) for Follow up, BP. Or sooner if needed. --Continue follow-up with your primary care physician regarding the management of your other chronic comorbid conditions.  Patient's questions and concerns were addressed to his satisfaction. He voices understanding of the instructions provided during this encounter.   This note was created using a voice recognition software as a result there may be grammatical errors inadvertently enclosed that do not reflect the nature of this encounter. Every attempt is made to correct such errors.  Tessa Lerner, [place], [facility]  Pager: [phone] Office: [phone]

## 2021-02-21 ENCOUNTER — Ambulatory Visit: Payer: PRIVATE HEALTH INSURANCE | Admitting: Cardiology

## 2021-03-03 ENCOUNTER — Other Ambulatory Visit: Payer: Self-pay | Admitting: Cardiology

## 2021-05-20 ENCOUNTER — Other Ambulatory Visit: Payer: Self-pay | Admitting: Cardiology

## 2021-05-20 DIAGNOSIS — R002 Palpitations: Secondary | ICD-10-CM

## 2021-05-20 DIAGNOSIS — I491 Atrial premature depolarization: Secondary | ICD-10-CM

## 2021-05-20 DIAGNOSIS — I10 Essential (primary) hypertension: Secondary | ICD-10-CM

## 2021-06-13 ENCOUNTER — Ambulatory Visit: Payer: PRIVATE HEALTH INSURANCE | Admitting: Cardiology

## 2021-07-18 ENCOUNTER — Ambulatory Visit: Payer: PRIVATE HEALTH INSURANCE | Admitting: Cardiology

## 2021-07-25 DIAGNOSIS — M67372 Transient synovitis, left ankle and foot: Secondary | ICD-10-CM | POA: Diagnosis not present

## 2021-08-21 ENCOUNTER — Other Ambulatory Visit: Payer: Self-pay | Admitting: Cardiology

## 2021-08-21 DIAGNOSIS — R002 Palpitations: Secondary | ICD-10-CM

## 2021-08-21 DIAGNOSIS — I491 Atrial premature depolarization: Secondary | ICD-10-CM

## 2021-08-21 DIAGNOSIS — I10 Essential (primary) hypertension: Secondary | ICD-10-CM

## 2021-10-01 DIAGNOSIS — M7751 Other enthesopathy of right foot: Secondary | ICD-10-CM | POA: Diagnosis not present

## 2021-10-01 DIAGNOSIS — M21611 Bunion of right foot: Secondary | ICD-10-CM | POA: Diagnosis not present

## 2021-10-01 DIAGNOSIS — M10079 Idiopathic gout, unspecified ankle and foot: Secondary | ICD-10-CM | POA: Diagnosis not present

## 2021-10-01 DIAGNOSIS — Z79899 Other long term (current) drug therapy: Secondary | ICD-10-CM | POA: Diagnosis not present

## 2021-10-01 DIAGNOSIS — M25571 Pain in right ankle and joints of right foot: Secondary | ICD-10-CM | POA: Diagnosis not present

## 2021-10-01 DIAGNOSIS — B349 Viral infection, unspecified: Secondary | ICD-10-CM | POA: Diagnosis not present

## 2021-10-01 DIAGNOSIS — M79671 Pain in right foot: Secondary | ICD-10-CM | POA: Diagnosis not present

## 2021-10-14 ENCOUNTER — Other Ambulatory Visit: Payer: Self-pay | Admitting: Cardiology

## 2021-12-25 DIAGNOSIS — R0982 Postnasal drip: Secondary | ICD-10-CM | POA: Diagnosis not present

## 2022-01-08 ENCOUNTER — Ambulatory Visit: Payer: PRIVATE HEALTH INSURANCE | Admitting: Cardiology

## 2022-02-05 DIAGNOSIS — G932 Benign intracranial hypertension: Secondary | ICD-10-CM | POA: Diagnosis not present

## 2022-02-11 ENCOUNTER — Telehealth: Payer: Self-pay | Admitting: Neurology

## 2022-02-11 ENCOUNTER — Other Ambulatory Visit: Payer: Self-pay | Admitting: Neurology

## 2022-02-11 DIAGNOSIS — G932 Benign intracranial hypertension: Secondary | ICD-10-CM

## 2022-02-11 NOTE — Telephone Encounter (Signed)
I called pt and relayed that per Dr. Lucia Gaskins she has ordered LP at [facility] and possibly a CT head to help with his pressure until diamox takes affect as per recommended by Dr. Dione Booze.  He stated had been on this previously when had similar issue previously.  I relayed to expect a call from them.  He verbalized understanding.  Has an appt in 2 wks with [place].  Pt verbalized understanding of plan.

## 2022-02-11 NOTE — Telephone Encounter (Signed)
We have seen patient in the past for idiopathic intracranial hypertension, confirmed by imaging and lumbar puncture opening pressure 32 at [facility] imaging.  It appears he was lost to follow-up and recently saw Dr. Dione Booze with significant papilledema.  He will be seeing [place] in 2 weeks however in the meantime Dr. Dione Booze recommended therapeutic lumbar puncture.  I ordered the lumbar puncture, pod 4 can you call and let patient know that he may be getting a call for a lumbar puncture(and possible CT head) to try and help with his pressure until the Diamox starts working and we see him.  Emily/Aubrey: He last had an MRI in 2019.  Can you find out if I will have to order a CT of the head prior to the lumbar puncture or not? Depends on [facility] what they need. thanks

## 2022-02-11 NOTE — Telephone Encounter (Signed)
Sent Cathy with [facility] a message about the imaging before the LP.

## 2022-02-12 NOTE — Telephone Encounter (Signed)
Just an [place] me back and informed me:  "Spoke with the tech and they said patient does not need to have another CT or MRI unless Dr. Jaynee Eagles is looking for something else"   I also asked her to schedule the patient as soon as possible for the LP.

## 2022-02-12 NOTE — Telephone Encounter (Signed)
Sounds good, thanks.

## 2022-02-14 ENCOUNTER — Other Ambulatory Visit: Payer: Self-pay

## 2022-02-14 ENCOUNTER — Ambulatory Visit
Admission: RE | Admit: 2022-02-14 | Discharge: 2022-02-14 | Disposition: A | Discharge status: Home / Self Care | Payer: BC Managed Care – PPO | Source: Ambulatory Visit | Attending: Neurology | Admitting: Neurology

## 2022-02-14 ENCOUNTER — Other Ambulatory Visit: Payer: Self-pay | Admitting: Neurology

## 2022-02-14 VITALS — BP 150/84 | HR 65

## 2022-02-14 DIAGNOSIS — H538 Other visual disturbances: Secondary | ICD-10-CM | POA: Diagnosis not present

## 2022-02-14 DIAGNOSIS — G932 Benign intracranial hypertension: Secondary | ICD-10-CM

## 2022-02-14 DIAGNOSIS — G912 (Idiopathic) normal pressure hydrocephalus: Secondary | ICD-10-CM | POA: Diagnosis not present

## 2022-02-14 LAB — CSF CELL COUNT WITH DIFFERENTIAL
RBC Count, CSF: 0 cells/uL
WBC, CSF: 2 cells/uL (ref 0–5)

## 2022-02-14 LAB — GLUCOSE, CSF: Glucose, CSF: 66 mg/dL (ref 40–80)

## 2022-02-14 LAB — PROTEIN, CSF: Total Protein, CSF: 26 mg/dL (ref 15–45)

## 2022-02-14 NOTE — Discharge Instructions (Signed)

## 2022-02-25 ENCOUNTER — Encounter: Payer: Self-pay | Admitting: Neurology

## 2022-02-25 ENCOUNTER — Ambulatory Visit: Payer: BC Managed Care – PPO | Admitting: Neurology

## 2022-02-25 ENCOUNTER — Other Ambulatory Visit: Payer: Self-pay

## 2022-02-25 VITALS — BP 168/101 | HR 78 | Ht 70.0 in | Wt 304.4 lb

## 2022-02-25 DIAGNOSIS — G932 Benign intracranial hypertension: Secondary | ICD-10-CM | POA: Diagnosis not present

## 2022-02-25 DIAGNOSIS — H5460 Unqualified visual loss, one eye, unspecified: Secondary | ICD-10-CM

## 2022-02-25 DIAGNOSIS — H4710 Unspecified papilledema: Secondary | ICD-10-CM

## 2022-02-25 MED ORDER — ACETAZOLAMIDE ER 500 MG PO CP12: 1000.0000 mg | ORAL_CAPSULE | Freq: Every evening | ORAL | 6 refills | Status: DC

## 2022-02-25 NOTE — Patient Instructions (Addendum)
[facility]: [phone] Call [facility] and ask if you should be on amlodipine in addition to metoprolol Doing well on Diamox continue Discussed weight loss MRI of the brain and MRV of vessels  Idiopathic Intracranial Hypertension Idiopathic intracranial hypertension (IIH) is a condition that increases pressure around the brain. The fluid that surrounds the brain and spinal cord (cerebrospinal fluid, or CSF) increases and causes the pressure. Idiopathic means that the cause of this condition is not known. IIH affects the brain and spinal cord (neurological disorder). If this condition is not treated, it can cause vision loss or blindness. What are the causes? The cause of this condition is not known. What increases the risk? The following factors may make you more likely to develop this condition: Being very overweight (obese). Being a male between the ages of 44 and 55 years old, who has not gone through menopause. Taking certain medicines, such as birth control or steroids. What are the signs or symptoms? Symptoms of this condition include: Headaches. This is the most common symptom. Brief episodes of total blindness. Double vision, blurred vision, or poor side (peripheral) vision. Pain in the shoulders or neck. Nausea and vomiting. A sound like rushing water or a pulsing sound within the ears (pulsatile tinnitus), or ringing in the ears. How is this diagnosed? This condition may be diagnosed based on: Your symptoms and medical history. Imaging tests of the brain, such as: CT scan. MRI. Magnetic resonance venogram (MRV) to check the veins. Diagnostic lumbar puncture. This is a procedure to remove and examine a sample of cerebrospinal fluid. This procedure can determine whether too much fluid may be causing IIH. A thorough eye exam to check for swelling or nerve damage in the eyes. How is this treated? Treatment for this condition depends on the  symptoms. The goal of treatment is to decrease the pressure around your brain. Common treatments include: Weight loss through healthy eating, salt restriction, and exercise, if you are overweight. Medicines to decrease the production of spinal fluid and lower the pressure within your skull. Medicines to prevent or treat headaches. Other treatments may include: Surgery to place drains (shunts) in your brain for removing excess fluid. Lumbar puncture to remove excess cerebrospinal fluid. Follow these instructions at home: If you are overweight or obese, work with your health care provider to lose weight. Take over-the-counter and prescription medicines only as told by your health care provider. Ask your health care provider if the medicine prescribed to you requires you to avoid driving or using machinery. Do not use any products that contain nicotine or tobacco, such as cigarettes, e-cigarettes, and chewing tobacco. If you need help quitting, ask your health care provider. Keep all follow-up visits as told by your health care provider. This is important. Contact a health care provider if: You have changes in your vision, such as: Double vision. Blurred vision. Poor peripheral vision. Get help right away if: You have any of the following symptoms and they get worse or do not get better: Headaches. Nausea. Vomiting. Sudden trouble seeing. Summary Idiopathic intracranial hypertension (IIH) is a condition that increases pressure around the brain. The cause is not known (is idiopathic). The most common symptom of IIH is headaches. Vision changes, pain in the shoulders or neck, nausea, and vomiting may also occur. Treatment for this condition depends on your symptoms. The goal of treatment is to decrease the pressure around your brain. If you are overweight or obese, work with your health care  provider to lose weight. Take over-the-counter and prescription medicines only as told by your health  care provider. This information is not intended to replace advice given to you by your health care provider. Make sure you discuss any questions you have with your health care provider. Document Revised: 11/26/2019 Document Reviewed: 11/26/2019 Elsevier Patient Education  2022 [employer].

## 2022-02-25 NOTE — Progress Notes (Signed)
[facility]    Provider:  Dr Lucia Gaskins Referring Provider: Dr. Marchelle Gearing Primary Care Physician:  Pcp, No  CC:  iih   02/25/2022: We have seen patient in the past for idiopathic intracranial hypertension, confirmed by imaging and lumbar puncture opening pressure 32 at [facility] imaging in 2019.  It appears he was lost to follow-up and recently saw Dr. Dione Booze with significant papilledema.   Dr. Dione Booze recommended therapeutic lumbar puncture.  I ordered the lumbar puncture,opening pressure was improved at 24. He was diagnosed with mild sleep apnea was not compliant. Discussed new implantable, may be a candidate. started having hazziness again to his left eye several months agp, started getting worse, fuzzy vision, got a little dark, his BP is elevated he is non compliant with his BP medications. He has not lost weight. He stopped the diamox several years ago after being on it and Dr. Dione Booze didn;t see any papilledema until recently. He went back recently and he had papilledema, started on diamox, we sent him for lp, he hasn't felt very bad. Just the vision in left eye. Vision improved since diamox. He has gained weight. No headaches. Improving. No other focal neurologic deficits, associated symptoms, inciting events or modifiable factors.  Patient complains of symptoms per HPI as well as the following symptoms: monocular vision loss . Pertinent negatives and positives per HPI. All others negative  IMPRESSION:  personally reviewed and agree with the following   MRI brain (with and without) demonstrating: - No intracranial mass, hemorrhage or acute findings.  - Of note slightly prominent / enlarged optic nerve sheaths are noted. Consider clinical correlation for idiopathic intracranial hypertension (pseudotumor cerebri).   HPI 01/2018:  Ronald George is a 41 y.o. male here as a referral from Dr. Nelson Chimes for numbness and tinglingin face.  Past medical history anxiety and depression.  Started 2 weeks ago. In Mid December he started feeling washed out, lethargic possibly worse after eating. Fatigue progressed since then. He saw his pcp and reported irregular heart rate and was started on propranolol. Propranolol helped with the heart palps. His dose was increased and Topamax was added for appetite. He is struggling with weight loss. Fatigue is worsening. Since Topamax he has tingling in the forehead. He stopped taking topamax and palpitations a week ago. He feels pressure in his temples. He has nausea. Dizziness makes him vomit. He can't focus on one object. He feels like he is spinning. His equilibrium is off. Can last 4 hours. No light sensitivity. No pulsating headache. Has had headaches behind the eyes, just dull an din the back of his head which are positional. No FHx migraines.  He wakes with these symptoms often. Snores heavily, wakes often. Never had a sleep test. No other focal neurologic deficits, associated symptoms, inciting events or modifiable factors.  Reviewed notes, labs and imaging from outside physicians, which showed:   Reviewed referring physician notes.  Patient presented yesterday for episodes of dizziness, numbness and tingling to his face and head.  He also had nausea and confusion with the episodes.  After the episodes he will go to sleep and then wake up feeling worn out.  Having episodes for a month.  He was started on propranolol for palpitations.  He would feel his heart race at times.  This was not necessarily associated with the numbness symptoms.  The palpitations have improved but the numbness episodes have not.  No chest pain with the episodes.  He does get blurred vision in  the left eye with the episodes and some dots in the eye when it happens.  CT 02/17/18 showed No acute intracranial abnormalities including mass lesion or mass effect, hydrocephalus, extra-axial fluid collection, midline shift, hemorrhage, or acute infarction, large ischemic events  (personally reviewed images)     Review of Systems: Patient complains of symptoms per HPI as well as the following symptoms: Confusion, numbness, weakness, slurred speech, dizziness, passing out, sleepiness, not enough sleep, decreased energy, blurred vision, fatigue, palpitations.. Pertinent negatives and positives per HPI. All others negative.   Social History   Socioeconomic History   Marital status: Single    Spouse name: Not on file   Number of children: 0   Years of education: Not on file   Highest education level: Some college, no degree  Occupational History   Not on file  Tobacco Use   Smoking status: Some Days   Smokeless tobacco: Never   Tobacco comments:    hookah occ 3 time in last week  Vaping Use   Vaping Use: Former   Quit date: 12/29/2017  Substance and Sexual Activity   Alcohol use: No    Alcohol/week: 0.0 standard drinks   Drug use: No   Sexual activity: Not on file  Other Topics Concern   Not on file  Social History Narrative   Lives at home alone   Right handed   Drinks 1 cup of tea daily   Social Determinants of Health   Financial Resource Strain: Not on file  Food Insecurity: Not on file  Transportation Needs: Not on file  Physical Activity: Not on file  Stress: Not on file  Social Connections: Not on file  Intimate Partner Violence: Not on file    Family History  Problem Relation Age of Onset   Hypertension Mother    Hypertension Father    Migraines Neg Hx     Past Medical History:  Diagnosis Date   Anxiety    Depression    Hypertension     Past Surgical History:  Procedure Laterality Date   CHOLECYSTECTOMY N/A 06/30/2016   Procedure: LAPAROSCOPIC CHOLECYSTECTOMY WITH INTRAOPERATIVE CHOLANGIOGRAM;  Surgeon: Abigail Miyamoto, MD;  Location: [facility] OR;  Service: General;  Laterality: N/A;   NO PAST SURGERIES     no anesthesia ever    Current Outpatient Medications  Medication Sig Dispense Refill   allopurinol (ZYLOPRIM) 100 MG  tablet Take 100 mg by mouth daily.     colchicine 0.6 MG tablet Take 0.6 mg by mouth daily.     ibuprofen (ADVIL) 200 MG tablet Take 200 mg by mouth as needed.     metoprolol succinate (TOPROL-XL) 25 MG 24 hr tablet TAKE 1 TABLET(25 MG) BY MOUTH DAILY 90 tablet 0   pantoprazole (PROTONIX) 40 MG tablet TAKE 1 TABLET(40 MG) BY MOUTH DAILY 30 tablet 6   acetaZOLAMIDE ER (DIAMOX) 500 MG capsule Take 2 capsules (1,000 mg total) by mouth at bedtime. 180 capsule 6   amLODipine (NORVASC) 10 MG tablet TAKE 1 TABLET(10 MG) BY MOUTH IN THE MORNING (Patient not taking: Reported on 02/25/2022) 90 tablet 0   No current facility-administered medications for this visit.   Facility-Administered Medications Ordered in Other Visits  Medication Dose Route Frequency Provider Last Rate Last Admin   gadopentetate dimeglumine (MAGNEVIST) injection 20 mL  20 mL Intravenous Once PRN Anson Fret, MD        Allergies as of 02/25/2022 - Review Complete 02/25/2022  Allergen Reaction Noted   Penicillins  04/17/2015    Vitals: BP (!) 168/101 Comment: provider aware   Pulse 78    Ht 5\' 10"  (1.778 m)    Wt (!) 304 lb 6.4 oz (138.1 kg)    BMI 43.68 kg/m  Last Weight:  Wt Readings from Last 1 Encounters:  02/25/22 (!) 304 lb 6.4 oz (138.1 kg)   Last Height:   Ht Readings from Last 1 Encounters:  02/25/22 5\' 10"  (1.778 m)   Physical exam: Exam: Gen: NAD, conversant, well nourised, morbidly obese, well groomed                     CV: RRR, no MRG. No Carotid Bruits. No peripheral edema, warm, nontender Eyes: Conjunctivae clear without exudates or hemorrhage  Neuro: Detailed Neurologic Exam  Speech:    Speech is normal; fluent and spontaneous with normal comprehension.  Cognition:    The patient is oriented to person, place, and time;     recent and remote memory intact;     language fluent;     normal attention, concentration,     fund of knowledge Cranial Nerves:    The pupils are equal, round, and  reactive to light. Left  1+> right 2+ papilledema. Extraocular movements are intact. Trigeminal sensation is intact and the muscles of mastication are normal. The face is symmetric. The palate elevates in the midline. Hearing intact. Voice is normal. Shoulder shrug is normal. The tongue has normal motion without fasciculations.   Coordination:    Normal   Gait:    Slightly wide based due to large body habitus   Motor Observation:    No asymmetry, no atrophy, and no involuntary movements noted. Tone:    Normal muscle tone.    Posture:    Posture is normal. normal erect    Strength:    Strength is V/V in the upper and lower limbs.      Sensation: intact to LT     Reflex Exam:  DTR's:    Deep tendon reflexes in the upper and lower extremities are normal bilaterally.   Toes:    The toes are downgoing bilaterally.   Clonus:    Clonus is absent.    Assessment/Plan:  41 year old with PMHx IDIOPATHIC INTRACRANIAL HYPERTENSION. We have seen patient in the past for idiopathic intracranial hypertension, confirmed by imaging and lumbar puncture opening [facility].  It appears he was lost to follow-up and recently saw Dr. Dione BoozeGroat with significant papilledema.   Dr. Dione BoozeGroat recommended therapeutic lumbar puncture.  I ordered the lumbar puncture,opening pressure was improved at 24. He was diagnosed with mild sleep apnea was not compliant.  - MRI brain w/wo contrast due to concerning symptoms of papilledema and monocular vision loss need MRI to eval for space-occupying lesion, masses, chiari, schwannoma, pseudotumor cerebri or any other intracranial etiology other than IDIOPATHIC INTRACRANIAL HYPERTENSION to ensure no other causes of symptoms because patient has improved and was off of the diamox, these symptoms are either new or recurrent  - Sleep evaluation for OSA: he did not tolerate, encouraged him to follow up, has gained weight  - continue diamox  -discussed  importance of weight gain, gave him [facility] info again, he never went  - [facility]: [phone] - Advised [facility] to [facility] and ask if you should be on amlodipine in addition to metoprolol, BP is elevated -Doing well on Diamox continue -Discussed weight  loss - Needs pcp   Orders Placed This Encounter  Procedures   MR BRAIN W WO CONTRAST   MR MRV HEAD WO CM   CBC with Differential/Platelets   Comprehensive metabolic panel   Hemoglobin A1c   TSH   T4, Free   Cc: Dr. Zacarias Pontes, MD  [facility] [address] [place], [place] [address]  Phone [phone] Fax [phone]  Cc: Marchelle Gearing

## 2022-02-26 DIAGNOSIS — G932 Benign intracranial hypertension: Secondary | ICD-10-CM | POA: Diagnosis not present

## 2022-02-26 LAB — COMPREHENSIVE METABOLIC PANEL
ALT: 92 IU/L — ABNORMAL HIGH (ref 0–44)
AST: 66 IU/L — ABNORMAL HIGH (ref 0–40)
Albumin/Globulin Ratio: 1.6 (ref 1.2–2.2)
Albumin: 4.5 g/dL (ref 4.0–5.0)
Alkaline Phosphatase: 99 IU/L (ref 44–121)
BUN/Creatinine Ratio: 9 (ref 9–20)
BUN: 10 mg/dL (ref 6–24)
Bilirubin Total: 0.6 mg/dL (ref 0.0–1.2)
CO2: 17 mmol/L — ABNORMAL LOW (ref 20–29)
Calcium: 9.2 mg/dL (ref 8.7–10.2)
Chloride: 108 mmol/L — ABNORMAL HIGH (ref 96–106)
Creatinine, Ser: 1.11 mg/dL (ref 0.76–1.27)
Globulin, Total: 2.8 g/dL (ref 1.5–4.5)
Glucose: 105 mg/dL — ABNORMAL HIGH (ref 70–99)
Potassium: 4.1 mmol/L (ref 3.5–5.2)
Sodium: 139 mmol/L (ref 134–144)
Total Protein: 7.3 g/dL (ref 6.0–8.5)
eGFR: 86 mL/min/{1.73_m2} (ref >59)

## 2022-02-26 LAB — CBC WITH DIFFERENTIAL/PLATELET
Basophils Absolute: 0.1 10*3/uL (ref 0.0–0.2)
Basos: 1 %
EOS (ABSOLUTE): 0.1 10*3/uL (ref 0.0–0.4)
Eos: 2 %
Hematocrit: 49.9 % (ref 37.5–51.0)
Hemoglobin: 16.8 g/dL (ref 13.0–17.7)
Immature Grans (Abs): 0 10*3/uL (ref 0.0–0.1)
Immature Granulocytes: 0 %
Lymphocytes Absolute: 2.2 10*3/uL (ref 0.7–3.1)
Lymphs: 29 %
MCH: 28.9 pg (ref 26.6–33.0)
MCHC: 33.7 g/dL (ref 31.5–35.7)
MCV: 86 fL (ref 79–97)
Monocytes Absolute: 0.5 10*3/uL (ref 0.1–0.9)
Monocytes: 7 %
Neutrophils Absolute: 4.6 10*3/uL (ref 1.4–7.0)
Neutrophils: 61 %
Platelets: 292 10*3/uL (ref 150–450)
RBC: 5.82 x10E6/uL — ABNORMAL HIGH (ref 4.14–5.80)
RDW: 13.2 % (ref 11.6–15.4)
WBC: 7.6 10*3/uL (ref 3.4–10.8)

## 2022-02-26 LAB — HEMOGLOBIN A1C
Est. average glucose Bld gHb Est-mCnc: 108 mg/dL
Hgb A1c MFr Bld: 5.4 % (ref 4.8–5.6)

## 2022-02-26 LAB — T4, FREE: Free T4: 1.2 ng/dL (ref 0.82–1.77)

## 2022-02-26 LAB — TSH: TSH: 1.5 u[IU]/mL (ref 0.450–4.500)

## 2022-03-05 ENCOUNTER — Telehealth: Payer: Self-pay | Admitting: Neurology

## 2022-03-05 NOTE — Telephone Encounter (Signed)
[employer] Josem Kaufmann: [id number] (exp. 03/05/22 to 04/03/22) order sent to [facility], they will reach out to the patient to schedule.  ?

## 2022-03-06 ENCOUNTER — Encounter: Payer: Self-pay | Admitting: Neurology

## 2022-03-06 ENCOUNTER — Other Ambulatory Visit: Payer: Self-pay | Admitting: Neurology

## 2022-03-06 MED ORDER — ALPRAZOLAM 0.25 MG PO TABS: ORAL_TABLET | ORAL | 0 refills | Status: DC

## 2022-03-26 ENCOUNTER — Ambulatory Visit
Admission: RE | Admit: 2022-03-26 | Discharge: 2022-03-26 | Disposition: A | Discharge status: Home / Self Care | Payer: PRIVATE HEALTH INSURANCE | Source: Ambulatory Visit | Attending: Neurology | Admitting: Neurology

## 2022-03-26 DIAGNOSIS — H5460 Unqualified visual loss, one eye, unspecified: Secondary | ICD-10-CM

## 2022-03-26 DIAGNOSIS — H4710 Unspecified papilledema: Secondary | ICD-10-CM

## 2022-03-26 MED ORDER — GADOBENATE DIMEGLUMINE 529 MG/ML IV SOLN
20.0000 mL | Freq: Once | INTRAVENOUS | Status: AC | PRN
  Administered 2022-03-26: 20 mL via INTRAVENOUS

## 2022-05-14 DIAGNOSIS — G932 Benign intracranial hypertension: Secondary | ICD-10-CM | POA: Diagnosis not present

## 2022-05-14 DIAGNOSIS — H04123 Dry eye syndrome of bilateral lacrimal glands: Secondary | ICD-10-CM | POA: Diagnosis not present

## 2022-05-14 DIAGNOSIS — H0102B Squamous blepharitis left eye, upper and lower eyelids: Secondary | ICD-10-CM | POA: Diagnosis not present

## 2022-05-14 DIAGNOSIS — H0102A Squamous blepharitis right eye, upper and lower eyelids: Secondary | ICD-10-CM | POA: Diagnosis not present

## 2022-05-21 DIAGNOSIS — Z0289 Encounter for other administrative examinations: Secondary | ICD-10-CM

## 2022-05-28 ENCOUNTER — Encounter: Payer: Self-pay | Admitting: Neurology

## 2022-05-28 ENCOUNTER — Ambulatory Visit (INDEPENDENT_AMBULATORY_CARE_PROVIDER_SITE_OTHER): Payer: BC Managed Care – PPO | Admitting: Neurology

## 2022-05-28 VITALS — BP 167/110 | HR 72 | Ht 70.0 in | Wt 309.8 lb

## 2022-05-28 DIAGNOSIS — G932 Benign intracranial hypertension: Secondary | ICD-10-CM

## 2022-05-28 DIAGNOSIS — F5101 Primary insomnia: Secondary | ICD-10-CM

## 2022-05-28 MED ORDER — TRAZODONE HCL 100 MG PO TABS: ORAL_TABLET | ORAL | 6 refills | Status: DC

## 2022-05-28 NOTE — Patient Instructions (Signed)
Decrease acetazolamide to 1 tab at bedtime Trazodone at bedtime  Trazodone Tablets What is this medication? TRAZODONE (TRAZ oh done) treats depression. It increases the amount of serotonin in the brain, a hormone that helps regulate mood. This medicine may be used for other purposes; ask your health care provider or pharmacist if you have questions. COMMON BRAND NAME(S): Desyrel What should I tell my care team before I take this medication? They need to know if you have any of these conditions: Attempted suicide or thinking about it Bipolar disorder Bleeding problems Glaucoma Heart disease, or previous heart attack Irregular heart beat Kidney or liver disease Low levels of sodium in the blood An unusual or allergic reaction to trazodone, other medications, foods, dyes or preservatives Pregnant or trying to get pregnant Breast-feeding How should I use this medication? Take this medication by mouth with a glass of water. Follow the directions on the prescription label. Take this medication shortly after a meal or a light snack. Take your medication at regular intervals. Do not take your medication more often than directed. Do not stop taking this medication suddenly except upon the advice of your care team. Stopping this medication too quickly may cause serious side effects or your condition may worsen. A special MedGuide will be given to you by the pharmacist with each prescription and refill. Be sure to read this information carefully each time. Talk to your care team regarding the use of this medication in children. Special care may be needed. Overdosage: If you think you have taken too much of this medicine contact a poison control center or emergency room at once. NOTE: This medicine is only for you. Do not share this medicine with others. What if I miss a dose? If you miss a dose, take it as soon as you can. If it is almost time for your next dose, take only that dose. Do not take  double or extra doses. What may interact with this medication? Do not take this medication with any of the following: Certain medications for fungal infections like fluconazole, itraconazole, ketoconazole, posaconazole, voriconazole Cisapride Dronedarone Linezolid MAOIs like Carbex, Eldepryl, Marplan, Nardil, and Parnate Mesoridazine Methylene blue (injected into a vein) Pimozide Saquinavir Thioridazine This medication may also interact with the following: Alcohol Antiviral medications for HIV or AIDS Aspirin and aspirin-like medications Barbiturates like phenobarbital Certain medications for blood pressure, heart disease, irregular heart beat Certain medications for depression, anxiety, or psychotic disturbances Certain medications for migraine headache like almotriptan, eletriptan, frovatriptan, naratriptan, rizatriptan, sumatriptan, zolmitriptan Certain medications for seizures like carbamazepine and phenytoin Certain medications for sleep Certain medications that treat or prevent blood clots like dalteparin, enoxaparin, warfarin Digoxin Fentanyl Lithium NSAIDS, medications for pain and inflammation, like ibuprofen or naproxen Other medications that prolong the QT interval (cause an abnormal heart rhythm) like dofetilide Rasagiline Supplements like St. John's wort, kava kava, valerian Tramadol Tryptophan This list may not describe all possible interactions. Give your health care provider a list of all the medicines, herbs, non-prescription drugs, or dietary supplements you use. Also tell them if you smoke, drink alcohol, or use illegal drugs. Some items may interact with your medicine. What should I watch for while using this medication? Tell your care team if your symptoms do not get better or if they get worse. Visit your care team for regular checks on your progress. Because it may take several weeks to see the full effects of this medication, it is important to continue your  treatment as prescribed  by your care team. Watch for new or worsening thoughts of suicide or depression. This includes sudden changes in mood, behaviors, or thoughts. These changes can happen at any time but are more common in the beginning of treatment or after a change in dose. Call your care team right away if you experience these thoughts or worsening depression. Manic episodes may happen in patients with bipolar disorder who take this medication. Watch for changes in feelings or behaviors such as feeling anxious, nervous, agitated, panicky, irritable, hostile, aggressive, impulsive, severely restless, overly excited and hyperactive, or trouble sleeping. These changes can happen at any time but are more common in the beginning of treatment or after a change in dose. Call your care team right away if you notice any of these symptoms. You may get drowsy or dizzy. Do not drive, use machinery, or do anything that needs mental alertness until you know how this medication affects you. Do not stand or sit up quickly, especially if you are an older patient. This reduces the risk of dizzy or fainting spells. Alcohol may interfere with the effect of this medication. Avoid alcoholic drinks. This medication may cause dry eyes and blurred vision. If you wear contact lenses you may feel some discomfort. Lubricating drops may help. See your eye doctor if the problem does not go away or is severe. Your mouth may get dry. Chewing sugarless gum, sucking hard candy and drinking plenty of water may help. Contact your care team if the problem does not go away or is severe. What side effects may I notice from receiving this medication? Side effects that you should report to your care team as soon as possible: Allergic reactions--skin rash, itching, hives, swelling of the face, lips, tongue, or throat Bleeding--bloody or black, tar-like stools, red or dark brown urine, vomiting blood or brown material that looks like coffee  grounds, small, red or purple spots on skin, unusual bleeding or bruising Heart rhythm changes--fast or irregular heartbeat, dizziness, feeling faint or lightheaded, chest pain, trouble breathing Low blood pressure--dizziness, feeling faint or lightheaded, blurry vision Low sodium level--muscle weakness, fatigue, dizziness, headache, confusion Prolonged or painful erection Serotonin syndrome--irritability, confusion, fast or irregular heartbeat, muscle stiffness, twitching muscles, sweating, high fever, seizures, chills, vomiting, diarrhea Sudden eye pain or change in vision such as blurry vision, seeing halos around lights, vision loss Thoughts of suicide or self-harm, worsening mood, feelings of depression Side effects that usually do not require medical attention (report to your care team if they continue or are bothersome): Change in sex drive or performance Constipation Dizziness Drowsiness Dry mouth This list may not describe all possible side effects. Call your doctor for medical advice about side effects. You may report side effects to FDA at 1-800-FDA-1088. Where should I keep my medication? Keep out of the reach of children and pets. Store at room temperature between 15 and 30 degrees C (59 to 86 degrees F). Protect from light. Keep container tightly closed. Throw away any unused medication after the expiration date. NOTE: This sheet is a summary. It may not cover all possible information. If you have questions about this medicine, talk to your doctor, pharmacist, or health care provider.  2023 Elsevier/Gold Standard (2020-12-05 00:00:00)

## 2022-05-28 NOTE — Progress Notes (Signed)
[facility]    Provider:  Dr Lucia Gaskins Referring Provider: Dr. Marchelle Gearing Primary Care Physician:  Pcp, No  CC:  iih   05/28/2022: He saw Dr. Dione Booze, he is on 1000mg  diazepam at bedtime, back almost completely normal per Dr. . See Dr. Dione Booze July 19th. Will follow up with dr. July 21 in July and then with me in August. Go down to one pill of the diamox due to considerable improvement. [facility]   02/2022: - MRI brain/MRV completed 03/06/2022: MRV head (without) demonstrating:Decreased signal in the superior sagittal sinus, left transverse and sigmoid sinus, and left internal jugular vein. This could be due to technical factors, hypoplasia or seen in association with idiopathic intracranial hypertension (pseudotumor cerebri). - MRI brain:  Stable, mild enlarged bilateral optic nerve sheaths.   Patient complains of symptoms per HPI as well as the following symptoms: insomnia, poor mood . Pertinent negatives and positives per HPI. All others negative  02/25/2022: We have seen patient in the past for idiopathic intracranial hypertension, confirmed by imaging and lumbar puncture opening pressure 32 at [facility] imaging in 2019.  It appears he was lost to follow-up and recently saw Dr. 2020 with significant papilledema.   Dr. Dione Booze recommended therapeutic lumbar puncture.  I ordered the lumbar puncture,opening pressure was improved at 24. He was diagnosed with mild sleep apnea was not compliant. Discussed new implantable, may be a candidate. started having hazziness again to his left eye several months agp, started getting worse, fuzzy vision, got a little dark, his BP is elevated he is non compliant with his BP medications. He has not lost weight. He stopped the diamox several years ago after being on it and Dr. Dione Booze didn;t see any papilledema until recently. He went back recently and he had papilledema, started on diamox, we sent him  for lp, he hasn't felt very bad. Just the vision in left eye. Vision improved since diamox. He has gained weight. No headaches. Improving. No other focal neurologic deficits, associated symptoms, inciting events or modifiable factors.  Patient complains of symptoms per HPI as well as the following symptoms: monocular vision loss . Pertinent negatives and positives per HPI. All others negative  IMPRESSION:  personally reviewed and agree with the following   MRI brain (with and without) demonstrating: - No intracranial mass, hemorrhage or acute findings.  - Of note slightly prominent / enlarged optic nerve sheaths are noted. Consider clinical correlation for idiopathic intracranial hypertension (pseudotumor cerebri).   HPI 01/2018:  Ronald George is a 41 y.o. male here as a referral from Dr. No ref. provider found for numbness and tinglingin face.  Past medical history anxiety and depression. Started 2 weeks ago. In Mid December he started feeling washed out, lethargic possibly worse after eating. Fatigue progressed since then. He saw his pcp and reported irregular heart rate and was started on propranolol. Propranolol helped with the heart palps. His dose was increased and Topamax was added for appetite. He is struggling with weight loss. Fatigue is worsening. Since Topamax he has tingling in the forehead. He stopped taking topamax and palpitations a week ago. He feels pressure in his temples. He has nausea. Dizziness makes him vomit. He can't focus on one object. He feels like he is spinning. His equilibrium is off. Can last 4 hours. No light sensitivity. No pulsating headache. Has had headaches behind the eyes, just dull an din the back of his head which are positional. No  FHx migraines.  He wakes with these symptoms often. Snores heavily, wakes often. Never had a sleep test. No other focal neurologic deficits, associated symptoms, inciting events or modifiable factors.  Reviewed notes, labs and  imaging from outside physicians, which showed:   Reviewed referring physician notes.  Patient presented yesterday for episodes of dizziness, numbness and tingling to his face and head.  He also had nausea and confusion with the episodes.  After the episodes he will go to sleep and then wake up feeling worn out.  Having episodes for a month.  He was started on propranolol for palpitations.  He would feel his heart race at times.  This was not necessarily associated with the numbness symptoms.  The palpitations have improved but the numbness episodes have not.  No chest pain with the episodes.  He does get blurred vision in the left eye with the episodes and some dots in the eye when it happens.  CT 02/17/18 showed No acute intracranial abnormalities including mass lesion or mass effect, hydrocephalus, extra-axial fluid collection, midline shift, hemorrhage, or acute infarction, large ischemic events (personally reviewed images)     Review of Systems: Patient complains of symptoms per HPI as well as the following symptoms: Confusion, numbness, weakness, slurred speech, dizziness, passing out, sleepiness, not enough sleep, decreased energy, blurred vision, fatigue, palpitations.. Pertinent negatives and positives per HPI. All others negative.   Social History   Socioeconomic History   Marital status: Single    Spouse name: Not on file   Number of children: 0   Years of education: Not on file   Highest education level: Some college, no degree  Occupational History   Not on file  Tobacco Use   Smoking status: Some Days   Smokeless tobacco: Never   Tobacco comments:    hookah occ 3 time in last week  Vaping Use   Vaping Use: Former   Quit date: 12/29/2017  Substance and Sexual Activity   Alcohol use: No    Alcohol/week: 0.0 standard drinks   Drug use: No   Sexual activity: Not on file  Other Topics Concern   Not on file  Social History Narrative   Lives at home alone   Right handed    Drinks 1 cup of tea daily   Social Determinants of Health   Financial Resource Strain: Not on file  Food Insecurity: Not on file  Transportation Needs: Not on file  Physical Activity: Not on file  Stress: Not on file  Social Connections: Not on file  Intimate Partner Violence: Not on file    Family History  Problem Relation Age of Onset   Hypertension Mother    Hypertension Father    Migraines Neg Hx     Past Medical History:  Diagnosis Date   Anxiety    Depression    Hypertension     Past Surgical History:  Procedure Laterality Date   CHOLECYSTECTOMY N/A 06/30/2016   Procedure: LAPAROSCOPIC CHOLECYSTECTOMY WITH INTRAOPERATIVE CHOLANGIOGRAM;  Surgeon: Abigail Miyamoto, MD;  Location: [facility] OR;  Service: General;  Laterality: N/A;   NO PAST SURGERIES     no anesthesia ever    Current Outpatient Medications  Medication Sig Dispense Refill   acetaZOLAMIDE ER (DIAMOX) 500 MG capsule Take 2 capsules (1,000 mg total) by mouth at bedtime. 180 capsule 6   allopurinol (ZYLOPRIM) 100 MG tablet Take 100 mg by mouth daily.     colchicine 0.6 MG tablet Take 0.6 mg by mouth daily.  ibuprofen (ADVIL) 200 MG tablet Take 200 mg by mouth as needed.     metoprolol succinate (TOPROL-XL) 25 MG 24 hr tablet TAKE 1 TABLET(25 MG) BY MOUTH DAILY 90 tablet 0   pantoprazole (PROTONIX) 40 MG tablet TAKE 1 TABLET(40 MG) BY MOUTH DAILY 30 tablet 6   traZODone (DESYREL) 100 MG tablet Take 1/2 to 1 pill at bedtime. 30 tablet 6   No current facility-administered medications for this visit.   Facility-Administered Medications Ordered in Other Visits  Medication Dose Route Frequency Provider Last Rate Last Admin   gadopentetate dimeglumine (MAGNEVIST) injection 20 mL  20 mL Intravenous Once PRN Anson Fret, MD        Allergies as of 05/28/2022 - Review Complete 05/28/2022  Allergen Reaction Noted   Penicillins  04/17/2015    Vitals: BP (!) 167/110   Pulse 72   Ht  (1.778 m)   Wt  (!) 309 lb 12.8 oz (140.5 kg)   BMI 44.45 kg/m  Last Weight:  Wt Readings from Last 1 Encounters:  05/28/22 (!) 309 lb 12.8 oz (140.5 kg)   Last Height:   Ht Readings from Last 1 Encounters:  05/28/22  (1.778 m)   Physical exam: Exam: Gen: NAD, conversant, well nourised, morbidly obese, well groomed                     CV: RRR, no MRG. No Carotid Bruits. No peripheral edema, warm, nontender Eyes: Conjunctivae clear without exudates or hemorrhage  Neuro: Detailed Neurologic Exam  Speech:    Speech is normal; fluent and spontaneous with normal comprehension.  Cognition:    The patient is oriented to person, place, and time;     recent and remote memory intact;     language fluent;     normal attention, concentration,     fund of knowledge Cranial Nerves:    The pupils are equal, round, and reactive to light. (Prior Left  1+> right 2+ papilledema) today significantly improved just some blurring of the margins. Extraocular movements are intact. Trigeminal sensation is intact and the muscles of mastication are normal. The face is symmetric. The palate elevates in the midline. Hearing intact. Voice is normal. Shoulder shrug is normal. The tongue has normal motion without fasciculations.   Coordination:    Normal   Gait:    Slightly wide based due to large body habitus   Motor Observation:    No asymmetry, no atrophy, and no involuntary movements noted. Tone:    Normal muscle tone.    Posture:    Posture is normal. normal erect    Strength:    Strength is V/V in the upper and lower limbs.      Sensation: intact to LT     Reflex Exam:  DTR's:    Deep tendon reflexes in the upper and lower extremities are normal bilaterally.   Toes:    The toes are downgoing bilaterally.   Clonus:    Clonus is absent.    Assessment/Plan:  41 year old with PMHx IDIOPATHIC INTRACRANIAL HYPERTENSION. We have seen patient in the past for idiopathic intracranial hypertension,  confirmed by imaging and lumbar puncture opening pressure 32 at [facility] imaging in 2019.  It appears he was lost to follow-up and recently saw Dr. Dione Booze with significant papilledema.   Dr. Dione Booze recommended therapeutic lumbar puncture.  I ordered the lumbar puncture,opening pressure was improved at 24. He was diagnosed with mild sleep apnea was  not compliant.  - Papilledema significatly improved, can decrease to one diamox at bedtime. F/u with Dr. Dione BoozeGroat in July thenme in August  02/2022: - MRI brain/MRV completed 03/06/2022: MRV head (without) demonstrating:Decreased signal in the superior sagittal sinus, left transverse and sigmoid sinus, and left internal jugular vein. This could be due to technical factors, hypoplasia or seen in association with idiopathic intracranial hypertension (pseudotumor cerebri). - MRI brain:  Stable, mild enlarged bilateral optic nerve sheaths.   - Sleep evaluation for OSA: he did not tolerate, encouraged him to follow up, has gained weight  -discussed importance of weight loss, gave him [facility] info again, he never went - has upcoming appointment  - Advised him to Call [facility] and ask if you should be on amlodipine in addition to metoprolol, BP is elevated, Needs pcp  - Trazodone at bedtime for sleep may also help with mood (Is an ssri)  Cc: Dr. Zacarias PontesAmin  Zian Mohamed, MD  [facility] [address] [place], [place] [id number]  Phone [phone] Fax [phone]  Cc: Marchelle Gearingchris groat  I spent 30 minutes of face-to-face and non-face-to-face time with patient on the  1. IIH (idiopathic intracranial hypertension)   2. Primary insomnia    diagnosis.  This included previsit chart review, lab review, study review, order entry, electronic health record documentation, patient education on the different diagnostic and therapeutic options, counseling and coordination of care, risks and benefits of management,  compliance, or risk factor reduction

## 2022-05-29 ENCOUNTER — Other Ambulatory Visit: Payer: Self-pay | Admitting: Cardiology

## 2022-06-02 ENCOUNTER — Other Ambulatory Visit: Payer: Self-pay

## 2022-06-02 MED ORDER — PANTOPRAZOLE SODIUM 40 MG PO TBEC: DELAYED_RELEASE_TABLET | ORAL | 0 refills | Status: DC

## 2022-06-04 DIAGNOSIS — I10 Essential (primary) hypertension: Secondary | ICD-10-CM | POA: Diagnosis not present

## 2022-06-04 DIAGNOSIS — E790 Hyperuricemia without signs of inflammatory arthritis and tophaceous disease: Secondary | ICD-10-CM | POA: Insufficient documentation

## 2022-06-04 DIAGNOSIS — K219 Gastro-esophageal reflux disease without esophagitis: Secondary | ICD-10-CM | POA: Diagnosis not present

## 2022-06-04 DIAGNOSIS — I491 Atrial premature depolarization: Secondary | ICD-10-CM | POA: Diagnosis not present

## 2022-06-09 ENCOUNTER — Other Ambulatory Visit: Payer: Self-pay

## 2022-06-25 ENCOUNTER — Ambulatory Visit: Payer: PRIVATE HEALTH INSURANCE | Admitting: Cardiology

## 2022-07-02 ENCOUNTER — Other Ambulatory Visit: Payer: Self-pay | Admitting: Cardiology

## 2022-07-09 ENCOUNTER — Encounter (INDEPENDENT_AMBULATORY_CARE_PROVIDER_SITE_OTHER): Payer: Self-pay | Admitting: Family Medicine

## 2022-07-09 ENCOUNTER — Ambulatory Visit (INDEPENDENT_AMBULATORY_CARE_PROVIDER_SITE_OTHER): Payer: BC Managed Care – PPO | Admitting: Family Medicine

## 2022-07-09 VITALS — BP 144/86 | HR 70 | Temp 98.2°F | Ht 69.0 in | Wt 306.0 lb

## 2022-07-09 DIAGNOSIS — I10 Essential (primary) hypertension: Secondary | ICD-10-CM | POA: Diagnosis not present

## 2022-07-09 DIAGNOSIS — R5383 Other fatigue: Secondary | ICD-10-CM

## 2022-07-09 DIAGNOSIS — E639 Nutritional deficiency, unspecified: Secondary | ICD-10-CM | POA: Diagnosis not present

## 2022-07-09 DIAGNOSIS — F39 Unspecified mood [affective] disorder: Secondary | ICD-10-CM

## 2022-07-09 DIAGNOSIS — E559 Vitamin D deficiency, unspecified: Secondary | ICD-10-CM | POA: Diagnosis not present

## 2022-07-09 DIAGNOSIS — G4733 Obstructive sleep apnea (adult) (pediatric): Secondary | ICD-10-CM | POA: Diagnosis not present

## 2022-07-09 DIAGNOSIS — R0602 Shortness of breath: Secondary | ICD-10-CM

## 2022-07-09 DIAGNOSIS — E66813 Obesity, class 3: Secondary | ICD-10-CM

## 2022-07-09 DIAGNOSIS — Z1331 Encounter for screening for depression: Secondary | ICD-10-CM

## 2022-07-09 DIAGNOSIS — Z6841 Body Mass Index (BMI) 40.0 and over, adult: Secondary | ICD-10-CM

## 2022-07-09 DIAGNOSIS — Z9189 Other specified personal risk factors, not elsewhere classified: Secondary | ICD-10-CM

## 2022-07-10 LAB — COMPREHENSIVE METABOLIC PANEL
ALT: 78 IU/L — ABNORMAL HIGH (ref 0–44)
AST: 62 IU/L — ABNORMAL HIGH (ref 0–40)
Albumin/Globulin Ratio: 1.7 (ref 1.2–2.2)
Albumin: 4.3 g/dL (ref 4.1–5.1)
Alkaline Phosphatase: 84 IU/L (ref 44–121)
BUN/Creatinine Ratio: 12 (ref 9–20)
BUN: 13 mg/dL (ref 6–24)
Bilirubin Total: 1.1 mg/dL (ref 0.0–1.2)
CO2: 22 mmol/L (ref 20–29)
Calcium: 8.9 mg/dL (ref 8.7–10.2)
Chloride: 101 mmol/L (ref 96–106)
Creatinine, Ser: 1.1 mg/dL (ref 0.76–1.27)
Globulin, Total: 2.5 g/dL (ref 1.5–4.5)
Glucose: 85 mg/dL (ref 70–99)
Potassium: 4.3 mmol/L (ref 3.5–5.2)
Sodium: 139 mmol/L (ref 134–144)
Total Protein: 6.8 g/dL (ref 6.0–8.5)
eGFR: 86 mL/min/{1.73_m2} (ref >59)

## 2022-07-10 LAB — HEMOGLOBIN A1C
Est. average glucose Bld gHb Est-mCnc: 108 mg/dL
Hgb A1c MFr Bld: 5.4 % (ref 4.8–5.6)

## 2022-07-10 LAB — CBC WITH DIFFERENTIAL/PLATELET
Basophils Absolute: 0.1 10*3/uL (ref 0.0–0.2)
Basos: 1 %
EOS (ABSOLUTE): 0.1 10*3/uL (ref 0.0–0.4)
Eos: 1 %
Hematocrit: 47.6 % (ref 37.5–51.0)
Hemoglobin: 16.1 g/dL (ref 13.0–17.7)
Immature Grans (Abs): 0 10*3/uL (ref 0.0–0.1)
Immature Granulocytes: 0 %
Lymphocytes Absolute: 2.2 10*3/uL (ref 0.7–3.1)
Lymphs: 35 %
MCH: 28.2 pg (ref 26.6–33.0)
MCHC: 33.8 g/dL (ref 31.5–35.7)
MCV: 84 fL (ref 79–97)
Monocytes Absolute: 0.5 10*3/uL (ref 0.1–0.9)
Monocytes: 8 %
Neutrophils Absolute: 3.5 10*3/uL (ref 1.4–7.0)
Neutrophils: 55 %
Platelets: 233 10*3/uL (ref 150–450)
RBC: 5.7 x10E6/uL (ref 4.14–5.80)
RDW: 12.9 % (ref 11.6–15.4)
WBC: 6.3 10*3/uL (ref 3.4–10.8)

## 2022-07-10 LAB — LIPID PANEL WITH LDL/HDL RATIO
Cholesterol, Total: 156 mg/dL (ref 100–199)
HDL: 38 mg/dL — ABNORMAL LOW (ref >39)
LDL Chol Calc (NIH): 98 mg/dL (ref 0–99)
LDL/HDL Ratio: 2.6 ratio (ref 0.0–3.6)
Triglycerides: 107 mg/dL (ref 0–149)
VLDL Cholesterol Cal: 20 mg/dL (ref 5–40)

## 2022-07-10 LAB — TSH: TSH: 2.09 u[IU]/mL (ref 0.450–4.500)

## 2022-07-10 LAB — VITAMIN B12: Vitamin B-12: 327 pg/mL (ref 232–1245)

## 2022-07-10 LAB — VITAMIN D 25 HYDROXY (VIT D DEFICIENCY, FRACTURES): Vit D, 25-Hydroxy: 22.6 ng/mL — ABNORMAL LOW (ref 30.0–100.0)

## 2022-07-10 LAB — INSULIN, RANDOM: INSULIN: 27.2 u[IU]/mL — ABNORMAL HIGH (ref 2.6–24.9)

## 2022-07-10 LAB — T4, FREE: Free T4: 1.31 ng/dL (ref 0.82–1.77)

## 2022-07-10 LAB — FOLATE: Folate: 8.6 ng/mL (ref >3.0)

## 2022-07-16 DIAGNOSIS — H0102A Squamous blepharitis right eye, upper and lower eyelids: Secondary | ICD-10-CM | POA: Diagnosis not present

## 2022-07-16 DIAGNOSIS — H0102B Squamous blepharitis left eye, upper and lower eyelids: Secondary | ICD-10-CM | POA: Diagnosis not present

## 2022-07-16 DIAGNOSIS — H04123 Dry eye syndrome of bilateral lacrimal glands: Secondary | ICD-10-CM | POA: Diagnosis not present

## 2022-07-16 DIAGNOSIS — G932 Benign intracranial hypertension: Secondary | ICD-10-CM | POA: Diagnosis not present

## 2022-07-18 NOTE — Progress Notes (Unsigned)
Chief Complaint:   OBESITY Ronald George (MR# [id number]) is a 41 y.o. male who presents for evaluation and treatment of obesity and related comorbidities. Current BMI is Body mass index is 45.19 kg/m. Ronald George has been struggling with his weight for many years and has been unsuccessful in either losing weight, maintaining weight loss, or reaching his healthy weight goal.  Ronald George is currently in the action stage of change and ready to dedicate time achieving and maintaining a healthier weight. Ronald George is interested in becoming our patient and working on intensive lifestyle modifications including (but not limited to) diet and exercise for weight loss.  Ronald George is divorced and lives with fiance, Ronald George, and 32 year old step-son, Ronald George. He eats out 4-5 times a week and drinks caloric beverages most days. 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  Ronald George's habits were reviewed today and are as follows: His family eats meals together, he thinks his family will eat healthier with him, his desired weight loss is 73, he has been heavy most of his life, he started gaining weight in the last 5 years, his heaviest weight ever was 310 pounds, he has significant food cravings issues, he skips meals frequently, he is frequently drinking liquids with calories, he frequently makes poor food choices, he frequently eats larger portions than normal, he has binge eating behaviors, and he struggles with emotional eating.  Depression Screen Ronald George's Food and Mood (modified PHQ-9) score was 13.     07/09/2022    8:31 AM  Depression screen PHQ 2/9  Decreased Interest 3  Down, Depressed, Hopeless 1  PHQ - 2 Score 4  Altered sleeping 3  Tired, decreased energy 3  Change in appetite 1  Feeling bad or failure about yourself  0  Trouble concentrating 1  Moving slowly or fidgety/restless 1  Suicidal thoughts 0  PHQ-9 Score 13  Difficult doing  work/chores Somewhat difficult   Subjective:   1. Other fatigue Ronald George admits to daytime somnolence and admits to waking up still tired. Patient has a history of symptoms of daytime fatigue and morning fatigue. Ronald George generally gets 6 hours of sleep per night, and states that he has poor sleep quality. Snoring is present. Apneic episodes are present. Epworth Sleepiness Score is 6.   2. SOB (shortness of breath) on exertion Ronald George notes increasing shortness of breath with exercising and seems to be worsening over time with weight gain. He notes getting out of breath sooner with activity than he used to. This has gotten worse recently. Ronald George denies shortness of breath at rest or orthopnea.  3. Essential hypertension Hypertension Care doctor recently increased metoprolol dose.  4. Mood disorder (HCC)- emotional eating Ronald George was started on trazodone for insomnia 1-2 months ago by Ronald George.  5. OSA (obstructive sleep apnea) He was first diagnosed with OSA 5-6 years ago. Ronald George did sleep study and diagnosed mild OSA, but pt cannot tolerate BiPAP. He is exploring other treatment avenues.  6. Vitamin D deficiency He is currently taking no vitamin D supplement. He denies nausea, vomiting or muscle weakness.  7. At risk for heart disease Ronald George is at higher than average risk for cardiovascular disease due to hypertension, obesity, and his lifestyle.  Assessment/Plan:   Orders Placed This Encounter  Procedures   VITAMIN D 25 Hydroxy (Vit-D Deficiency, Fractures)   TSH   T4, free   Lipid Panel With LDL/HDL Ratio  Insulin, random   Hemoglobin A1c   Folate   Comprehensive metabolic panel   CBC with Differential/Platelet   Vitamin B12   EKG 12-Lead    There are no discontinued medications.   No orders of the defined types were placed in this encounter.    1. Other fatigue Ronald George does feel that his weight is causing his energy to be lower than it should be. Fatigue may  be related to obesity, depression or many other causes. Labs will be ordered, and in the meanwhile, Ronald George will focus on self care including making healthy food choices, increasing physical activity and focusing on stress reduction. ECG done with normal sinus rhythm with rate in the low 60's. No acute abnormalities. Obtain fasting labs today.  - EKG 12-Lead - TSH - T4, free - CBC with Differential/Platelet  2. SOB (shortness of breath) on exertion Ronald George does feel that he gets out of breath more easily that he used to when he exercises. Ronald George's shortness of breath appears to be obesity related and exercise induced. He has agreed to work on weight loss and gradually increase exercise to treat his exercise induced shortness of breath. Will continue to monitor closely.  3. Essential hypertension Not at goal today. Ronald George is working on healthy weight loss and exercise to improve blood pressure control. We will watch for signs of hypotension as he continues his lifestyle modifications. Continue medication per PCP, follow prudent nutritional plan, and decrease salt intake. Weight loss needed. Obtain fasting labs today.  - Lipid Panel With LDL/HDL Ratio - Insulin, random - Hemoglobin A1c - Comprehensive metabolic panel  4. Mood disorder (HCC)- emotional eating Stable. Behavior modification techniques were discussed today to help [name] deal with his emotional/non-hunger eating behaviors.  Orders and follow up as documented in patient record. Pt denies need for counseling at this time.  5. OSA (obstructive sleep apnea) Intensive lifestyle modifications are the first line treatment for this issue. We discussed several lifestyle modifications today and he will continue to work on diet, exercise and weight loss efforts. We will continue to monitor. Orders and follow up as documented in patient record. Care per sleep med doc. Counseling done on the importance of control of symptoms to help with  weight loss.  6. Vitamin D deficiency Low Vitamin D level contributes to fatigue and are associated with obesity, breast, and colon cancer. He will follow-up for routine testing of Vitamin D, at least 2-3 times per year to avoid over-replacement. Obtain fasting labs today.  - VITAMIN D 25 Hydroxy (Vit-D Deficiency, Fractures) - Folate - Vitamin B12  7. Depression screen Ronald George had a positive depression screening. Depression is commonly associated with obesity and often results in emotional eating behaviors. We will monitor this closely and work on CBT to help improve the non-hunger eating patterns. Referral to Psychology may be required if no improvement is seen as he continues in our clinic.  8. At risk for heart disease Due to [facility] current state of health and medical condition(s), he is at a higher risk for heart disease.  This puts the patient at much greater risk to subsequently develop cardiopulmonary conditions that can significantly affect patient's quality of life in a negative manner as well.     At least 24 minutes were spent on counseling Ronald George about these concerns today, and we discussed the importance of reversing risks factors of obesity, especially truncal and visceral fat, hypertension, hyperlipidemia, and pre-diabetes.  The initial goal is to lose  at least 5-10% of starting weight to help reduce these risk factors.  Counseling: Intensive lifestyle modifications were discussed with Ronald George as the most appropriate first line of treatment.  he will continue to work on diet, exercise, and weight loss efforts.  We will continue to reassess these conditions on a fairly regular basis in an attempt to decrease the patient's overall morbidity and mortality.  Evidence-based interventions for health behavior change were utilized today including the discussion of self monitoring techniques, problem-solving barriers, and SMART goal setting techniques.  Specifically,  regarding patient's less desirable eating habits and patterns, we employed the technique of small changes when Ronald George has not been able to fully commit to his prudent nutritional plan.  9. Class 3 severe obesity with serious comorbidity and body mass index (BMI) of 45.0 to 49.9 in adult, unspecified obesity type (HCC) Ronald George is currently in the action stage of change and his goal is to continue with weight loss efforts. I recommend Ronald George begin the structured treatment plan as follows:  He has agreed to the Category 3 Plan with breakfast options.  Exercise goals:  As is    Behavioral modification strategies: increasing lean protein intake, decreasing simple carbohydrates, decreasing liquid calories, and decreasing eating out.  He was informed of the importance of frequent follow-up visits to maximize his success with intensive lifestyle modifications for his multiple health conditions. He was informed we would discuss his lab results at his next visit unless there is a critical issue that needs to be addressed sooner. Hank agreed to keep his next visit at the agreed upon time to discuss these results.  Objective:   Blood pressure (!) 144/86, pulse 70, temperature 98.2 F (36.8 C), height 5\' 9"  (1.753 m), weight (!) 306 lb (138.8 kg), SpO2 98 %. Body mass index is 45.19 kg/m.  EKG: Normal sinus rhythm, rate 61.  Indirect Calorimeter completed today shows a VO2 of 361 and a REE of 2491.  His calculated basal metabolic rate is thus his basal metabolic rate is worse than expected.  General: Cooperative, alert, well developed, in no acute distress. HEENT: Conjunctivae and lids unremarkable. Cardiovascular: Regular rhythm.  Lungs: Normal work of breathing. Neurologic: No focal deficits.   Lab Results  Component Value Date   CREATININE 1.10 07/09/2022   BUN 13 07/09/2022   NA 139 07/09/2022   K 4.3 07/09/2022   CL 101 07/09/2022   CO2 22 07/09/2022   Lab Results   Component Value Date   ALT 78 (H) 07/09/2022   AST 62 (H) 07/09/2022   ALKPHOS 84 07/09/2022   BILITOT 1.1 07/09/2022   Lab Results  Component Value Date   HGBA1C 5.4 07/09/2022   HGBA1C 5.4 02/25/2022   Lab Results  Component Value Date   INSULIN 27.2 (H) 07/09/2022   Lab Results  Component Value Date   TSH 2.090 07/09/2022   Lab Results  Component Value Date   CHOL 156 07/09/2022   HDL 38 (L) 07/09/2022   LDLCALC 98 07/09/2022   TRIG 107 07/09/2022   CHOLHDL 3.9 03/02/2020   Lab Results  Component Value Date   WBC 6.3 07/09/2022   HGB 16.1 07/09/2022   HCT 47.6 07/09/2022   MCV 84 07/09/2022   PLT 233 07/09/2022    Attestation Statements:   Reviewed by clinician on day of visit: allergies, medications, problem list, medical history, surgical history, family history, social history, and previous encounter notes.  I, 09/09/2022, BS, CMA, am  acting as Energy manager for [employer], DO.  I have reviewed the above documentation for accuracy and completeness, and I agree with the above. Carlye Grippe, D.O.  The 21st Century Cures Act was signed into law in 2016 which includes the topic of electronic health records.  This provides immediate access to information in MyChart.  This includes consultation notes, operative notes, office notes, lab results and pathology reports.  If you have any questions about what you read please let us know at your next visit so we can discuss your concerns and take corrective action if need be.  We are right here with you.

## 2022-07-23 ENCOUNTER — Encounter (INDEPENDENT_AMBULATORY_CARE_PROVIDER_SITE_OTHER): Payer: Self-pay | Admitting: Family Medicine

## 2022-07-23 ENCOUNTER — Ambulatory Visit (INDEPENDENT_AMBULATORY_CARE_PROVIDER_SITE_OTHER): Payer: BC Managed Care – PPO | Admitting: Family Medicine

## 2022-07-23 VITALS — BP 138/87 | HR 75 | Temp 98.0°F | Ht 69.0 in | Wt 300.0 lb

## 2022-07-23 DIAGNOSIS — E559 Vitamin D deficiency, unspecified: Secondary | ICD-10-CM

## 2022-07-23 DIAGNOSIS — E8881 Metabolic syndrome: Secondary | ICD-10-CM | POA: Diagnosis not present

## 2022-07-23 DIAGNOSIS — K760 Fatty (change of) liver, not elsewhere classified: Secondary | ICD-10-CM | POA: Diagnosis not present

## 2022-07-23 DIAGNOSIS — E538 Deficiency of other specified B group vitamins: Secondary | ICD-10-CM

## 2022-07-23 DIAGNOSIS — I10 Essential (primary) hypertension: Secondary | ICD-10-CM | POA: Diagnosis not present

## 2022-07-23 DIAGNOSIS — E669 Obesity, unspecified: Secondary | ICD-10-CM

## 2022-07-23 DIAGNOSIS — Z9189 Other specified personal risk factors, not elsewhere classified: Secondary | ICD-10-CM

## 2022-07-23 DIAGNOSIS — Z6841 Body Mass Index (BMI) 40.0 and over, adult: Secondary | ICD-10-CM

## 2022-07-23 MED ORDER — VITAMIN D (ERGOCALCIFEROL) 1.25 MG (50000 UNIT) PO CAPS: 50000.0000 [IU] | ORAL_CAPSULE | ORAL | 0 refills | Status: DC

## 2022-07-23 MED ORDER — CYANOCOBALAMIN 500 MCG PO TABS: 500.0000 ug | ORAL_TABLET | Freq: Every day | ORAL | Status: DC

## 2022-07-27 NOTE — Progress Notes (Signed)
Chief Complaint:   OBESITY Ronald George is here to discuss his progress with his obesity treatment plan along with follow-up of his obesity related diagnoses. Ronald George is on the Category 3 Plan with breakfast options and states he is following his eating plan approximately 75% of the time. Ronald George states he is walking 25-30 minutes 3-4 times per week.  Today's visit was #: 2 Starting weight: 306 lbs Starting date: 07/09/2022 Today's weight: 300 lbs Today's date: 07/23/2022 Total lbs lost to date: 6 Total lbs lost since last in-office visit: 6  Interim History: Ronald George is here today for his first follow-up office visit since starting the program with Ronald George.  All blood work/ lab tests that were recently ordered by myself or an outside provider were reviewed with patient today per their request.   Extended time was spent counseling him on all new disease processes that were discovered or preexisting ones that are affected by BMI.  he understands that many of these abnormalities will need to monitored regularly along with the current treatment plan of prudent dietary changes, in which we are making each and every office visit, to improve these health parameters. Ronald George is here for a follow up office visit.  We reviewed his meal plan and all questions were answered.  Patient's food recall appears to be accurate and consistent with what is on plan when he is following it.   When eating on plan, his hunger and cravings are well controlled.   Pt did great and gained 16+ lbs in muscle mass and lost 22.4 lbs in fat mass. He denies issues with meal plan.  Subjective:   1. Essential hypertension Discussed labs with patient today. Pt's HDL/good cholesterol is too low at 38. His serum creatinine is 1.10 and normal but a little high for his age.  2. Vitamin D deficiency Worsening. Discussed labs with patient today. Pt is not taking any supplements, but does ha a history of Vit D deficiency. His  Vit D level is too low at 22.6. Pt is asymptomatic.  3. Insulin resistance New. Discussed labs with patient today. Pt historically ate carb-rich foods. He does not like sweets but likes bread, pasta, rice, potatoes.  4. NAFLD (nonalcoholic fatty liver disease) New. Discussed labs with patient today. Upon review of chart, an ultrasound was done 03/2016 of abdomen that revealed significant hepatic steatosis. AST= 62 and ALT= 78.  5. B12 deficiency due to diet New. Discussed labs with patient today. B12 level at 327. Pt has no history of B12 deficiency. He does report fatigue and being tired a lot.  6. At risk of diabetes mellitus Ronald George is at risk for diabetes mellitus due to new onset insulin resistance.  Assessment/Plan:  No orders of the defined types were placed in this encounter.   There are no discontinued medications.   Meds ordered this encounter  Medications   Vitamin D, Ergocalciferol, (DRISDOL) 1.25 MG (50000 UNIT) CAPS capsule    Sig: Take 1 capsule (50,000 Units total) by mouth every 7 (seven) days.    Dispense:  4 capsule    Refill:  0   cyanocobalamin (VITAMIN B12) 500 MCG tablet    Sig: Take 1 tablet (500 mcg total) by mouth daily.     1. Essential hypertension Ronald George is working on healthy weight loss and exercise to improve blood pressure control. We will watch for signs of hypotension as he continues his lifestyle modifications. CMP/serum creatinine within normal limits, except LFT's. Pt's  BP improved from prior. Decrease salt intake and increase water.  2. Vitamin D deficiency - I discussed the importance of vitamin D to the patient's health and well-being.  - I reviewed possible symptoms of low Vitamin D:  low energy, depressed mood, muscle aches, joint aches, osteoporosis etc. was reviewed with patient - low Vitamin D levels may be linked to an increased risk of cardiovascular events and even increased risk of cancers- such as colon and breast.  - ideal  vitamin D levels reviewed with patient  - I recommend pt take a 50,000 IU weekly prescription vit D - see script below   - Informed patient this may be a lifelong thing, and he was encouraged to continue to take the medicine until told otherwise.    - weight loss will likely improve availability of vitamin D, thus encouraged Ronald George to continue with meal plan and their weight loss efforts to further improve this condition.  Thus, we will need to monitor levels regularly (every 3-4 mo on average) to keep levels within normal limits and prevent over supplementation. - pt's questions and concerns regarding this condition addressed.  Start- Vitamin D, Ergocalciferol, (DRISDOL) 1.25 MG (50000 UNIT) CAPS capsule; Take 1 capsule (50,000 Units total) by mouth every 7 (seven) days.  Dispense: 4 capsule; Refill: 0  3. Insulin resistance New onset. Pt's fasting insulin is 27.2. Ronald George will continue to work on weight loss, exercise, and decreasing simple carbohydrates to help decrease the risk of diabetes. Ronald George agreed to follow-up with Ronald George as directed to closely monitor his progress.  Handout: Insulin Resistance  4. NAFLD (nonalcoholic fatty liver disease) Pt with elevated ALT and AST.   Plan:  Lab results reviewed with patient.  Disease counseling done.  Intensive lifestyle modifications are the first line treatment for this issue.  We discussed several lifestyle modifications today and he will continue to work on diet, exercise and weight loss efforts.   Counseling: NAFLD is an umbrella term that encompasses a disease spectrum that includes steatosis (fat) without inflammation, steatohepatitis (NASH; fat + inflammation in a characteristic pattern), and cirrhosis. Bland steatosis is felt to be a benign condition, with extremely low to no risk of progression to cirrhosis, whereas NASH can progress to cirrhosis. The mainstay of treatment of NAFLD includes lifestyle modification to achieve weight loss, at  least 7% of current body weight. Low carbohydrate diets can be beneficial in improving NAFLD liver histology. Additionally, exercise, even the absence of weight loss can have beneficial effects on the patient's metabolic profile and liver health. We recommend that their metabolic comorbidities be aggressively managed, as patients with NAFLD are at increased risk of coronary artery disease.  5. B12 deficiency due to diet The diagnosis was reviewed with the patient. Counseling provided today, see below. We will continue to monitor. Orders and follow up as documented in patient record. Follow prudent nutritional plan.  Counseling The body needs vitamin B12: to make red blood cells; to make DNA; and to help the nerves work properly so they can carry messages from the brain to the body.  The main causes of vitamin B12 deficiency include dietary deficiency, digestive diseases, pernicious anemia, and having a surgery in which part of the stomach or small intestine is removed.  Certain medicines can make it harder for the body to absorb vitamin B12. These medicines include: heartburn medications; some antibiotics; some medications used to treat diabetes, gout, and high cholesterol.  In some cases, there are no symptoms of this  condition. If the condition leads to anemia or nerve damage, various symptoms can occur, such as weakness or fatigue, shortness of breath, and numbness or tingling in your hands and feet.   Treatment:  May include taking vitamin B12 supplements.  Avoid alcohol.  Eat lots of healthy foods that contain vitamin B12: Beef, pork, chicken, [place], and organ meats, such as liver.  Seafood: This includes clams, rainbow trout, salmon, tuna, and haddock. Eggs.  Cereal and dairy products that are fortified: This means that vitamin B12 has been added to the food.   Start- cyanocobalamin (VITAMIN B12) 500 MCG tablet; Take 1 tablet (500 mcg total) by mouth daily.  6. At risk of diabetes  mellitus - Mizraim was given diabetes prevention education and counseling today of more than 25 minutes.  - Counseled patient on pathophysiology of disease and meaning/ implication of lab results.  - Reviewed how certain foods can either stimulate or inhibit insulin release, and subsequently affect hunger pathways  - Importance of following a healthy meal plan with limiting amounts of simple carbohydrates discussed with patient - Effects of regular aerobic exercise on blood sugar regulation reviewed and encouraged an eventual goal of 30 min 5d/week or more as a minimum.  - Briefly discussed treatment options, which always include dietary and lifestyle modification as first line.   - Handouts provided at patient's desire and/or told to go online to the American Diabetes Association website for further information.  7. Obesity, current BMI 44.3 Keeven is currently in the action stage of change. As such, his goal is to continue with weight loss efforts. He has agreed to the Category 3 Plan with breakfast options.   Exercise goals:  As is  Behavioral modification strategies: increasing lean protein intake and decreasing simple carbohydrates.  Pookela has agreed to follow-up with our clinic in 2 weeks. He was informed of the importance of frequent follow-up visits to maximize his success with intensive lifestyle modifications for his multiple health conditions.   Objective:   Blood pressure 138/87, pulse 75, temperature 98 F (36.7 C), height 5\' 9"  (1.753 m), weight 300 lb (136.1 kg), SpO2 98 %. Body mass index is 44.3 kg/m.  General: Cooperative, alert, well developed, in no acute distress. HEENT: Conjunctivae and lids unremarkable. Cardiovascular: Regular rhythm.  Lungs: Normal work of breathing. Neurologic: No focal deficits.   Lab Results  Component Value Date   CREATININE 1.10 07/09/2022   BUN 13 07/09/2022   NA 139 07/09/2022   K 4.3 07/09/2022   CL 101 07/09/2022   CO2 22  07/09/2022   Lab Results  Component Value Date   ALT 78 (H) 07/09/2022   AST 62 (H) 07/09/2022   ALKPHOS 84 07/09/2022   BILITOT 1.1 07/09/2022   Lab Results  Component Value Date   HGBA1C 5.4 07/09/2022   HGBA1C 5.4 02/25/2022   Lab Results  Component Value Date   INSULIN 27.2 (H) 07/09/2022   Lab Results  Component Value Date   TSH 2.090 07/09/2022   Lab Results  Component Value Date   CHOL 156 07/09/2022   HDL 38 (L) 07/09/2022   LDLCALC 98 07/09/2022   TRIG 107 07/09/2022   CHOLHDL 3.9 03/02/2020   Lab Results  Component Value Date   VD25OH 22.6 (L) 07/09/2022   Lab Results  Component Value Date   WBC 6.3 07/09/2022   HGB 16.1 07/09/2022   HCT 47.6 07/09/2022   MCV 84 07/09/2022   PLT 233 07/09/2022  Attestation Statements:   Reviewed by clinician on day of visit: allergies, medications, problem list, medical history, surgical history, family history, social history, and previous encounter notes.  I, Kyung Rudd, BS, CMA, am acting as transcriptionist for [employer], DO.  I have reviewed the above documentation for accuracy and completeness, and I agree with the above. Carlye Grippe, D.O.  The 21st Century Cures Act was signed into law in 2016 which includes the topic of electronic health records.  This provides immediate access to information in MyChart.  This includes consultation notes, operative notes, office notes, lab results and pathology reports.  If you have any questions about what you read please let Ronald George know at your next visit so we can discuss your concerns and take corrective action if need be.  We are right here with you.

## 2022-07-30 ENCOUNTER — Encounter: Payer: Self-pay | Admitting: Neurology

## 2022-07-30 ENCOUNTER — Telehealth (INDEPENDENT_AMBULATORY_CARE_PROVIDER_SITE_OTHER): Payer: BC Managed Care – PPO | Admitting: Neurology

## 2022-07-30 ENCOUNTER — Telehealth: Payer: Self-pay | Admitting: Neurology

## 2022-07-30 DIAGNOSIS — G932 Benign intracranial hypertension: Secondary | ICD-10-CM

## 2022-07-30 MED ORDER — ACETAZOLAMIDE ER 500 MG PO CP12: ORAL_CAPSULE | ORAL | 3 refills | Status: DC

## 2022-07-30 NOTE — Progress Notes (Signed)
[facility]    Provider:  Dr Lucia Gaskins Referring Provider: Dr. Marchelle Gearing Primary Care Physician:  Loura Pardon, PA  CC:  iih   Virtual Visit via Video Note  I connected with Ronald George on 07/30/22 at  8:30 AM EDT by a video enabled telemedicine application and verified that I am speaking with the correct person using two identifiers.  Location: Patient: home Provider: office   I discussed the limitations of evaluation and management by telemedicine and the availability of in person appointments. The patient expressed understanding and agreed to proceed.  Follow Up Instructions:    I discussed the assessment and treatment plan with the patient. The patient was provided an opportunity to ask questions and all were answered. The patient agreed with the plan and demonstrated an understanding of the instructions.   The patient was advised to call back or seek an in-person evaluation if the symptoms worsen or if the condition fails to improve as anticipated.  I provided over 40 minutes of non-face-to-face time during this encounter.   Ronald Fret, MD   Interval history July 30, 2022: I reviewed ophthalmology Dr. Laruth Bouchard note from 07/16/2022,  He has discussed OCD with Dr. 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  He still has significant optic nerve head edema 2+ right eye 1+ left eye.  Taking 1000mg  Diamox at bedtime, will add 500mg  in the morning. Saw Dr. 07/16/2022 and still with significant ONH edema. Increasing diamox. Symptomatically he is feeling better, he has noticed vision loss in his left eye. [facility]. Sees Dr. Dione Booze in September. We will see him in October. I will [facility] and inquire if they can put him on wegovy to expedite weight  loss  Patient complains of symptoms per HPI as well as the following symptoms: vision loss . Pertinent negatives and positives per HPI. All others negative   05/28/2022: He saw Dr. November, he is on 1000mg  diazepam at bedtime, back almost completely normal per Dr. 05/30/2022. See Dr. Dione Booze July 19th. Will follow up with dr. Dione Booze in July and then with me in August. Go down to one pill of the diamox due to considerable improvement. [facility]   02/2022: - MRI brain/MRV completed 03/06/2022: MRV head (without) demonstrating:Decreased signal in the superior sagittal sinus, left transverse and sigmoid sinus, and left internal jugular vein. This could be due to technical factors, hypoplasia or seen in association with idiopathic intracranial hypertension (pseudotumor cerebri). - MRI brain:  Stable, mild enlarged bilateral optic nerve sheaths.   Patient complains of symptoms per HPI as well as the following symptoms: insomnia, poor mood . Pertinent negatives and positives per HPI. All others negative  02/25/2022: We have seen patient in the past for idiopathic intracranial hypertension, confirmed by imaging and lumbar puncture opening pressure 32 at [facility] imaging in 2019.  It appears he was lost to follow-up and recently saw Dr. 02/27/2022 with significant papilledema.   Dr. [facility] recommended therapeutic lumbar puncture.  I ordered the lumbar puncture,opening pressure was improved at 24. He was diagnosed with mild sleep apnea was not compliant. Discussed new implantable, may be a candidate. started having hazziness again to his left eye several months agp, started getting worse, fuzzy vision, got a  little dark, his BP is elevated he is non compliant with his BP medications. He has not lost weight. He stopped the diamox several years ago after being on it and Dr. Dione Booze didn;t see any papilledema until recently. He went back recently and he had papilledema, started on  diamox, we sent him for lp, he hasn't felt very bad. Just the vision in left eye. Vision improved since diamox. He has gained weight. No headaches. Improving. No other focal neurologic deficits, associated symptoms, inciting events or modifiable factors.  Patient complains of symptoms per HPI as well as the following symptoms: monocular vision loss . Pertinent negatives and positives per HPI. All others negative  IMPRESSION:  personally reviewed and agree with the following   MRI brain (with and without) demonstrating: - No intracranial mass, hemorrhage or acute findings.  - Of note slightly prominent / enlarged optic nerve sheaths are noted. Consider clinical correlation for idiopathic intracranial hypertension (pseudotumor cerebri).   HPI 01/2018:  Ronald George is a 41 y.o. male here as a referral from Dr. Kathrin Penner for numbness and tinglingin face.  Past medical history anxiety and depression. Started 2 weeks ago. In Mid December he started feeling washed out, lethargic possibly worse after eating. Fatigue progressed since then. He saw his pcp and reported irregular heart rate and was started on propranolol. Propranolol helped with the heart palps. His dose was increased and Topamax was added for appetite. He is struggling with weight loss. Fatigue is worsening. Since Topamax he has tingling in the forehead. He stopped taking topamax and palpitations a week ago. He feels pressure in his temples. He has nausea. Dizziness makes him vomit. He can't focus on one object. He feels like he is spinning. His equilibrium is off. Can last 4 hours. No light sensitivity. No pulsating headache. Has had headaches behind the eyes, just dull an din the back of his head which are positional. No FHx migraines.  He wakes with these symptoms often. Snores heavily, wakes often. Never had a sleep test. No other focal neurologic deficits, associated symptoms, inciting events or modifiable factors.  Reviewed notes, labs  and imaging from outside physicians, which showed:   Reviewed referring physician notes.  Patient presented yesterday for episodes of dizziness, numbness and tingling to his face and head.  He also had nausea and confusion with the episodes.  After the episodes he will go to sleep and then wake up feeling worn out.  Having episodes for a month.  He was started on propranolol for palpitations.  He would feel his heart race at times.  This was not necessarily associated with the numbness symptoms.  The palpitations have improved but the numbness episodes have not.  No chest pain with the episodes.  He does get blurred vision in the left eye with the episodes and some dots in the eye when it happens.  CT 02/17/18 showed No acute intracranial abnormalities including mass lesion or mass effect, hydrocephalus, extra-axial fluid collection, midline shift, hemorrhage, or acute infarction, large ischemic events (personally reviewed images)     Review of Systems: Patient complains of symptoms per HPI as well as the following symptoms: Confusion, numbness, weakness, slurred speech, dizziness, passing out, sleepiness, not enough sleep, decreased energy, blurred vision, fatigue, palpitations.. Pertinent negatives and positives per HPI. All others negative.   Social History   Socioeconomic History   Marital status: Significant Other    Spouse name: Renaldo Reel   Number of children: 0   Years  of education: Not on file   Highest education level: Some college, no degree  Occupational History   Occupation: Research scientist (physical sciences)  Tobacco Use   Smoking status: Some Days   Smokeless tobacco: Never   Tobacco comments:    hookah occ 3 time in last week  Vaping Use   Vaping Use: Former   Quit date: 12/29/2017  Substance and Sexual Activity   Alcohol use: No    Alcohol/week: 0.0 standard drinks of alcohol   Drug use: No   Sexual activity: Not on file  Other Topics Concern   Not on file   Social History Narrative   Lives at home alone   Right handed   Drinks 1 cup of tea daily   Social Determinants of Health   Financial Resource Strain: Not on file  Food Insecurity: Not on file  Transportation Needs: Not on file  Physical Activity: Not on file  Stress: Not on file  Social Connections: Not on file  Intimate Partner Violence: Not on file    Family History  Problem Relation Age of Onset   Hypertension Mother    High Cholesterol Mother    Depression Mother    Anxiety disorder Mother    Obesity Mother    Hypertension Father    Sleep apnea Father    Migraines Neg Hx     Past Medical History:  Diagnosis Date   Acid reflux    Anxiety    Anxiety    Chest pain    Depression    Depression    Fatty liver    Gallbladder problem    Gout    Hypertension    Idiopathic intracranial hypertension    Moody    Palpitations    Sleep apnea    Sleepiness    SOB (shortness of breath)     Past Surgical History:  Procedure Laterality Date   CHOLECYSTECTOMY N/A 06/30/2016   Procedure: LAPAROSCOPIC CHOLECYSTECTOMY WITH INTRAOPERATIVE CHOLANGIOGRAM;  Surgeon: Abigail Miyamoto, MD;  Location: [facility] OR;  Service: General;  Laterality: N/A;   NO PAST SURGERIES     no anesthesia ever    Current Outpatient Medications  Medication Sig Dispense Refill   acetaZOLAMIDE ER (DIAMOX) 500 MG capsule Take 1000mg  at bedtime (2 pills) and 500mg  in the morning (1 pill). If doing well in 2-4 weeks increase to 1000mg  (2 pills) twice daily. 360 capsule 3   allopurinol (ZYLOPRIM) 100 MG tablet Take 100 mg by mouth daily.     colchicine 0.6 MG tablet Take 0.6 mg by mouth daily.     cyanocobalamin (VITAMIN B12) 500 MCG tablet Take 1 tablet (500 mcg total) by mouth daily.     ibuprofen (ADVIL) 200 MG tablet Take 200 mg by mouth as needed.     metoprolol succinate (TOPROL-XL) 25 MG 24 hr tablet TAKE 1 TABLET(25 MG) BY MOUTH DAILY 90 tablet 0   pantoprazole (PROTONIX) 40 MG tablet TAKE 1  TABLET(40 MG) BY MOUTH DAILY 30 tablet 0   traZODone (DESYREL) 100 MG tablet Take 1/2 to 1 pill at bedtime. 30 tablet 6   Vitamin D, Ergocalciferol, (DRISDOL) 1.25 MG (50000 UNIT) CAPS capsule Take 1 capsule (50,000 Units total) by mouth every 7 (seven) days. 4 capsule 0   No current facility-administered medications for this visit.   Facility-Administered Medications Ordered in Other Visits  Medication Dose Route Frequency Provider Last Rate Last Admin   gadopentetate dimeglumine (MAGNEVIST) injection 20 mL  20 mL Intravenous Once  PRN Ronald Fret, MD        Allergies as of 07/30/2022 - Review Complete 07/23/2022  Allergen Reaction Noted   Penicillins  04/17/2015    Vitals: There were no vitals taken for this visit. Last Weight:  Wt Readings from Last 1 Encounters:  07/23/22 300 lb (136.1 kg)   Last Height:   Ht Readings from Last 1 Encounters:  07/23/22 5\' 9"  (1.753 m)    Physical exam: Exam: Gen: NAD, conversant      CV: attempted, Could not perform over Web Video. Denies palpitations or chest pain or SOB. VS: Breathing at a normal rate. Weight appears obese. Not febrile. Eyes: Conjunctivae clear without exudates or hemorrhage  Neuro: Detailed Neurologic Exam  Speech:    Speech is normal; fluent and spontaneous with normal comprehension.  Cognition:    The patient is oriented to person, place, and time;     recent and remote memory intact;     language fluent;     normal attention, concentration,     fund of knowledge Cranial Nerves:    The pupils are equal, round, and reactive to light. Attempted, Cannot perform fundoscopic exam. Visual fields are full to finger confrontation. (Prior Left  1+> right 2+ papilledema) +APD OS. Extraocular movements are intact.  The face is symmetric with normal sensation. The palate elevates in the midline. Hearing intact. Voice is normal. Shoulder shrug is normal. The tongue has normal motion without fasciculations.    Coordination:    Normal finger to nose  Gait:    Normal native gait  Motor Observation:   no involuntary movements noted. Tone:    Appears normal  Posture:    Posture is normal. normal erect    Strength:    Strength is anti-gravity and symmetric in the upper and lower limbs.      Sensation: intact to LT       Assessment/Plan:  41 year old with PMHx IDIOPATHIC INTRACRANIAL HYPERTENSION. We have seen patient in the past for idiopathic intracranial hypertension, confirmed by imaging and lumbar puncture opening pressure 32 at [facility] imaging in 2019.  It appears he was lost to follow-up and recently saw Dr. 2020 with significant papilledema.   Dr. Dione Booze recommended therapeutic lumbar puncture.  I ordered the lumbar puncture,opening pressure was improved at 24. He was diagnosed with mild sleep apnea was not compliant. Saw him via video today, asked for in office visits  I reviewed ophthalmology Dr. Dione Booze note from 07/16/2022,  patient has discussed OCD with Dr. 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  He still has significant optic nerve head edema 2+ right eye 1+ left eye per ophtho.  Taking 1000mg  Diamox at bedtime, will add 500mg  in the morning.  Symptomatically however he is feeling better, but he has noticed vision loss in his left eye. [facility]. Sees Dr. Dione Booze in September. We will see him in October. I will [facility] and inquire if they can put him on wegovy to expedite weight loss  - MRI brain/MRV completed 03/06/2022: MRV head (without) demonstrating:Decreased signal in the superior sagittal sinus, left transverse and sigmoid sinus, and left internal jugular vein. This could be due to technical factors, hypoplasia or seen in association with idiopathic intracranial hypertension  (pseudotumor cerebri). - MRI brain:  Stable, mild enlarged bilateral optic nerve sheaths.   - Sleep evaluation for OSA: he did not tolerate, encouraged him to follow up, has gained weight, we discussed the new treatment but he prefers to continue to lose weight and that is what he is concentrating on.   -discussed importance of weight loss, now going to [facility]  - f/u with pcp for HTN and medical management  - Trazodone at bedtime for sleep may also help with mood (Is an ssri)  Meds ordered this encounter  Medications   acetaZOLAMIDE ER (DIAMOX) 500 MG capsule    Sig: Take 1000mg  at bedtime (2 pills) and 500mg  in the morning (1 pill). If doing well in 2-4 weeks increase to 1000mg  (2 pills) twice daily.    Dispense:  360 capsule    Refill:  3     Cc: Dr. Kathrin PennerGarcia-Grider, Dr. Marchelle Gearinghris Groat  Naomie DeanAntonia Nydia Ytuarte, MD  [facility] [address] [place], [place] [id number]  Phone [phone] Fax [phone]

## 2022-07-30 NOTE — Telephone Encounter (Signed)
Can you call patient and schedule follow up with me in October can be video

## 2022-07-30 NOTE — Telephone Encounter (Signed)
Pt scheduled for VV on 10/01/22 at 8:30.

## 2022-07-30 NOTE — Patient Instructions (Addendum)
Increase diamox Contact [facility] Dr. Sharee Holster to see if Ronald George is something that can help See Dr. Dione Booze in September, see me back in October He sees Dr. Sharee Holster often, will ask her to check labs for increase in Diamox in September  Acetazolamide Extended-Release Capsules What is this medication? ACETAZOLAMIDE (a set a ZOLE a mide) treats conditions with increased pressure of the eye, such as glaucoma. It works by decreasing the amount of fluid in the eye, which helps lower eye pressure. It may also be used to prevent or treat symptoms of altitude sickness. It works by increasing the amount of oxygen in your body. It belongs to a group of medications called diuretics. This medicine may be used for other purposes; ask your health care provider or pharmacist if you have questions. COMMON BRAND NAME(S): Diamox, Diamox Sequels What should I tell my care team before I take this medication? They need to know if you have any of these conditions: Diabetes Glaucoma Kidney disease Liver disease Low adrenal gland function Lung or breathing disease An unusual or allergic reaction to acetazolamide, ther medications, foods, dyes, or preservatives Pregnant or trying to get pregnant Breast-feeding How should I use this medication? Take this medication by mouth. Take it as directed on the prescription label at the same time every day. You can take it with or without food. If it upsets your stomach, take it with food. Keep taking it unless your care team tells you to stop. Talk to your care team about the use of this medication in children. While it may be prescribed for children as young as 12 years for selected conditions, precautions do apply. Overdosage: If you think you have taken too much of this medicine contact a poison control center or emergency room at once. NOTE: This medicine is only for you. Do not share this medicine with others. What if I miss a dose? If you miss a dose, take it as soon as you  can. If it is almost time for your next dose, take only that dose. Do not take double or extra doses. What may interact with this medication? Do not take this medication with any of the following: Methazolamide This medication may also interact with the following: Aspirin and aspirin-like medications Cyclosporine Lithium Medication for diabetes Methenamine Other diuretics Phenytoin Primidone Quinidine Sodium bicarbonate Stimulant medications, such as dextroamphetamine This list may not describe all possible interactions. Give your health care provider a list of all the medicines, herbs, non-prescription drugs, or dietary supplements you use. Also tell them if you smoke, drink alcohol, or use illegal drugs. Some items may interact with your medicine. What should I watch for while using this medication? Visit your care team for regular checks on your progress. Tell your care team if your symptoms do not start to get better or if they get worse. This medication may cause serious skin reactions. They can happen weeks to months after starting the medication. Contact your care team right away if you notice fevers or flu-like symptoms with a rash. The rash may be red or purple and then turn into blisters or peeling of the skin. Or, you might notice a red rash with swelling of the face, lips, or lymph nodes in your neck or under your arms. This medication may affect your coordination, reaction time, or judgment. Do not drive or operate machinery until you know how this medication affects you. Sit up or stand slowly to reduce the risk of dizzy or fainting spells. What  side effects may I notice from receiving this medication? Side effects that you should report to your care team as soon as possible: Allergic reactions--skin rash, itching, hives, swelling of the face, lips, tongue, or throat Aplastic anemia--unusual weakness or fatigue, dizziness, headache, trouble breathing, increased bleeding or  bruising High acid level--trouble breathing, unusual weakness or fatigue, confusion, headache, fast or irregular heartbeat, nausea, vomiting High blood sugar (hyperglycemia)--increased thirst or amount of urine, unusual weakness or fatigue, blurry vision Infection--fever, chills, cough, or sore throat Kidney stones--blood in the urine, pain or trouble passing urine, pain in the lower back or sides Liver injury--right upper belly pain, loss of appetite, nausea, light-colored stool, dark yellow or brown urine, yellowing skin or eyes, unusual weakness or fatigue Low blood sugar (hypoglycemia)--tremors or shaking, anxiety, sweating, cold or clammy skin, confusion, dizziness, rapid heartbeat Low potassium level--muscle pain or cramps, unusual weakness or fatigue, fast or irregular heartbeat, constipation Redness, blistering, peeling, or loosening of the skin, including inside the mouth Side effects that usually do not require medical attention (report to your care team if they continue or are bothersome): Change in taste Diarrhea Dizziness Fatigue Nausea Pain, tingling, or numbness in the hands or feet This list may not describe all possible side effects. Call your doctor for medical advice about side effects. You may report side effects to FDA at 1-800-FDA-1088. Where should I keep my medication? Keep out of the reach of children and pets. Store at room temperature between 20 and 25 degrees C (68 and 77 degrees F). Throw away any unused medication after the expiration date. NOTE: This sheet is a summary. It may not cover all possible information. If you have questions about this medicine, talk to your doctor, pharmacist, or health care provider.  2023 Elsevier/Gold Standard (2022-01-28 00:00:00)

## 2022-08-06 ENCOUNTER — Encounter (INDEPENDENT_AMBULATORY_CARE_PROVIDER_SITE_OTHER): Payer: Self-pay | Admitting: Family Medicine

## 2022-08-06 ENCOUNTER — Ambulatory Visit (INDEPENDENT_AMBULATORY_CARE_PROVIDER_SITE_OTHER): Payer: BC Managed Care – PPO | Admitting: Family Medicine

## 2022-08-06 ENCOUNTER — Encounter (INDEPENDENT_AMBULATORY_CARE_PROVIDER_SITE_OTHER): Payer: Self-pay

## 2022-08-06 VITALS — BP 138/84 | HR 62 | Temp 98.6°F | Ht 69.0 in | Wt 296.0 lb

## 2022-08-06 DIAGNOSIS — G932 Benign intracranial hypertension: Secondary | ICD-10-CM | POA: Diagnosis not present

## 2022-08-06 DIAGNOSIS — E538 Deficiency of other specified B group vitamins: Secondary | ICD-10-CM

## 2022-08-06 DIAGNOSIS — Z6841 Body Mass Index (BMI) 40.0 and over, adult: Secondary | ICD-10-CM

## 2022-08-06 DIAGNOSIS — Z9189 Other specified personal risk factors, not elsewhere classified: Secondary | ICD-10-CM

## 2022-08-06 DIAGNOSIS — E8881 Metabolic syndrome: Secondary | ICD-10-CM | POA: Diagnosis not present

## 2022-08-06 DIAGNOSIS — E669 Obesity, unspecified: Secondary | ICD-10-CM | POA: Diagnosis not present

## 2022-08-06 MED ORDER — SEMAGLUTIDE-WEIGHT MANAGEMENT 0.25 MG/0.5ML ~~LOC~~ SOAJ: 0.2500 mg | SUBCUTANEOUS | 0 refills | Status: DC

## 2022-08-06 MED ORDER — CYANOCOBALAMIN 500 MCG PO TABS
500.0000 ug | ORAL_TABLET | Freq: Every day | ORAL | Status: DC
Start: 2022-08-06 — End: 2023-02-11

## 2022-08-11 ENCOUNTER — Telehealth (INDEPENDENT_AMBULATORY_CARE_PROVIDER_SITE_OTHER): Payer: Self-pay | Admitting: Family Medicine

## 2022-08-11 ENCOUNTER — Encounter (INDEPENDENT_AMBULATORY_CARE_PROVIDER_SITE_OTHER): Payer: Self-pay

## 2022-08-11 NOTE — Telephone Encounter (Signed)
Dr. Sharee Holster - Prior authorization approved for [id number]. Effective: 08/07/2022 to 12/10/2022. Patient sent approval message via mychart.

## 2022-08-13 ENCOUNTER — Encounter (INDEPENDENT_AMBULATORY_CARE_PROVIDER_SITE_OTHER): Payer: Self-pay | Admitting: Family Medicine

## 2022-08-15 NOTE — Progress Notes (Signed)
Chief Complaint:   OBESITY Ronald George is here to discuss his progress with his obesity treatment plan along with follow-up of his obesity related diagnoses. Ronald George is on the Category 3 Plan with breakfast options  and states he is following his eating plan approximately 85% of the time. Ronald George states he is walking 20-30 minutes 3-4 times per week.  Today's visit was #: 3 Starting weight: 306 lbs Starting date: 07/09/2022 Today's weight: 296 lbs Today's date: 08/06/2022 Total lbs lost to date: 10 Total lbs lost since last in-office visit: 4  Interim History: Ronald George is struggling to eat everything and skips dinner a lot due to his work schedule and having no time. He is not always measuring proteins, as his fiance, Ronald George prepares his meals.  Subjective:   1. IIH (idiopathic intracranial hypertension) Ronald George has increased papilledema and some loss of vision in his left eye. Dr. Lucia Gaskins recommends a GLP-1 to speed up weight loss, which would, in turn, decrease blood pressures.  2. Insulin resistance Goal is HgbA1c < 5.7, fasting insulin closer to 5.    3. B12 deficiency due to diet Pt forgot to start the OTC B12 after we discussed that his B12 level was not at goal.  4. At risk for malnutrition Ronald George is at risk for malnutrition due to skipping meals.  Assessment/Plan:  No orders of the defined types were placed in this encounter.   Medications Discontinued During This Encounter  Medication Reason   cyanocobalamin (VITAMIN B12) 500 MCG tablet Reorder     Meds ordered this encounter  Medications   cyanocobalamin (VITAMIN B12) 500 MCG tablet    Sig: Take 1 tablet (500 mcg total) by mouth daily.   DISCONTD: Semaglutide-Weight Management 0.25 MG/0.5ML SOAJ    Sig: Inject 0.25 mg into the skin once a week.    Dispense:  2 mL    Refill:  0     1. IIH (idiopathic intracranial hypertension) Start [facility] after risks and benefits discussed with pt. Pt has no personal history  pancreatitis and no family history of medullary tyroid cancer. Continue medical treatment plan, otherwise per ophthalmologist, Ronald George and neurologist, Dr. Lucia Gaskins.  2. Insulin resistance Ronald George will continue to work on weight loss, exercise, and decreasing simple carbohydrates to help decrease the risk of diabetes. Ronald George agreed to follow-up with [place] as directed to closely monitor his progress.   Start- Semaglutide-Weight Management 0.25 MG/0.5ML SOAJ; Inject 0.25 mg into the skin once a week.  Dispense: 2 mL; Refill: 0  3. B12 deficiency due to diet The diagnosis was reviewed with the patient. Counseling provided today, see below. We will continue to monitor. Orders and follow up as documented in patient record.  Counseling The body needs vitamin B12: to make red blood cells; to make DNA; and to help the nerves work properly so they can carry messages from the brain to the body.  The main causes of vitamin B12 deficiency include dietary deficiency, digestive diseases, pernicious anemia, and having a surgery in which part of the stomach or small intestine is removed.  Certain medicines can make it harder for the body to absorb vitamin B12. These medicines include: heartburn medications; some antibiotics; some medications used to treat diabetes, gout, and high cholesterol.  In some cases, there are no symptoms of this condition. If the condition leads to anemia or nerve damage, various symptoms can occur, such as weakness or fatigue, shortness of breath, and numbness or tingling in your hands and  feet.   Treatment:  May include taking vitamin B12 supplements.  Avoid alcohol.  Eat lots of healthy foods that contain vitamin B12: Beef, pork, chicken, [place], and organ meats, such as liver.  Seafood: This includes clams, rainbow trout, salmon, tuna, and haddock. Eggs.  Cereal and dairy products that are fortified: This means that vitamin B12 has been added to the food.   Start- cyanocobalamin  (VITAMIN B12) 500 MCG tablet; Take 1 tablet (500 mcg total) by mouth daily.  4. At risk for malnutrition Ronald George was given extensive malnutrition prevention education and counseling today of more than 9 minutes.  Counseled him that malnutrition refers to inappropriate nutrients or not the right balance of nutrients for optimal health.  Discussed with Ronald George that it is absolutely possible to be malnourished but yet obese.  Risk factors, including but not limited to, inappropriate dietary choices, difficulty with obtaining food due to physical or financial limitations, and various physical and mental health conditions were reviewed with Ronald George.   5. Obesity, current BMI 43.8 Ronald George is currently in the action stage of change. As such, his goal is to continue with weight loss efforts. He has agreed to the Category 3 Plan with breakfast and lunch options.   Pt will moe proteins from dinner to lunch and vise versa. Strategies to eat everything on plan discussed with pt.  Exercise goals:  As is  Behavioral modification strategies: increasing lean protein intake, decreasing simple carbohydrates, no skipping meals, and planning for success.  Ronald George has agreed to follow-up with our clinic in 2-3 weeks. He was informed of the importance of frequent follow-up visits to maximize his success with intensive lifestyle modifications for his multiple health conditions.   Objective:   Blood pressure 138/84, pulse 62, temperature 98.6 F (37 C), height 5\' 9"  (1.753 m), weight 296 lb (134.3 kg), SpO2 98 %. Body mass index is 43.71 kg/m.  General: Cooperative, alert, well developed, in no acute distress. HEENT: Conjunctivae and lids unremarkable. Cardiovascular: Regular rhythm.  Lungs: Normal work of breathing. Neurologic: No focal deficits.   Lab Results  Component Value Date   CREATININE 1.10 07/09/2022   BUN 13 07/09/2022   NA 139 07/09/2022   K 4.3 07/09/2022   CL 101 07/09/2022    CO2 22 07/09/2022   Lab Results  Component Value Date   ALT 78 (H) 07/09/2022   AST 62 (H) 07/09/2022   ALKPHOS 84 07/09/2022   BILITOT 1.1 07/09/2022   Lab Results  Component Value Date   HGBA1C 5.4 07/09/2022   HGBA1C 5.4 02/25/2022   Lab Results  Component Value Date   INSULIN 27.2 (H) 07/09/2022   Lab Results  Component Value Date   TSH 2.090 07/09/2022   Lab Results  Component Value Date   CHOL 156 07/09/2022   HDL 38 (L) 07/09/2022   LDLCALC 98 07/09/2022   TRIG 107 07/09/2022   CHOLHDL 3.9 03/02/2020   Lab Results  Component Value Date   VD25OH 22.6 (L) 07/09/2022   Lab Results  Component Value Date   WBC 6.3 07/09/2022   HGB 16.1 07/09/2022   HCT 47.6 07/09/2022   MCV 84 07/09/2022   PLT 233 07/09/2022   Attestation Statements:   Reviewed by clinician on day of visit: allergies, medications, problem list, medical history, surgical history, family history, social history, and previous encounter notes.  I, 09/09/2022, BS, CMA, am acting as transcriptionist for Kyung Rudd, DO.  I have reviewed the above  documentation for accuracy and completeness, and I agree with the above. [name], D.O.  The [place] was signed into law in 2016 which includes the topic of electronic health records.  This provides immediate access to information in MyChart.  This includes consultation notes, operative notes, office notes, lab results and pathology reports.  If you have any questions about what you read please let us know at your next visit so we can discuss your concerns and take corrective action if need be.  We are right here with you.

## 2022-08-17 IMAGING — XA DG SPINAL PUNCT LUMBAR DIAG WITH FL CT GUIDANCE
1 series · 1 of 1 positions shown · non-contrast
Comparison: March 31, 2018.

CLINICAL DATA: Idiopathic intracranial hypertension. Was off Diamox
for awhile but recently restarted it due to new onset blurry vision
with optic nerve swelling.

EXAM:
DIAGNOSTIC LUMBAR PUNCTURE UNDER FLUOROSCOPIC GUIDANCE

[Series 1: ortho standard · 1 of 1 slices shown]
[im 1/1]
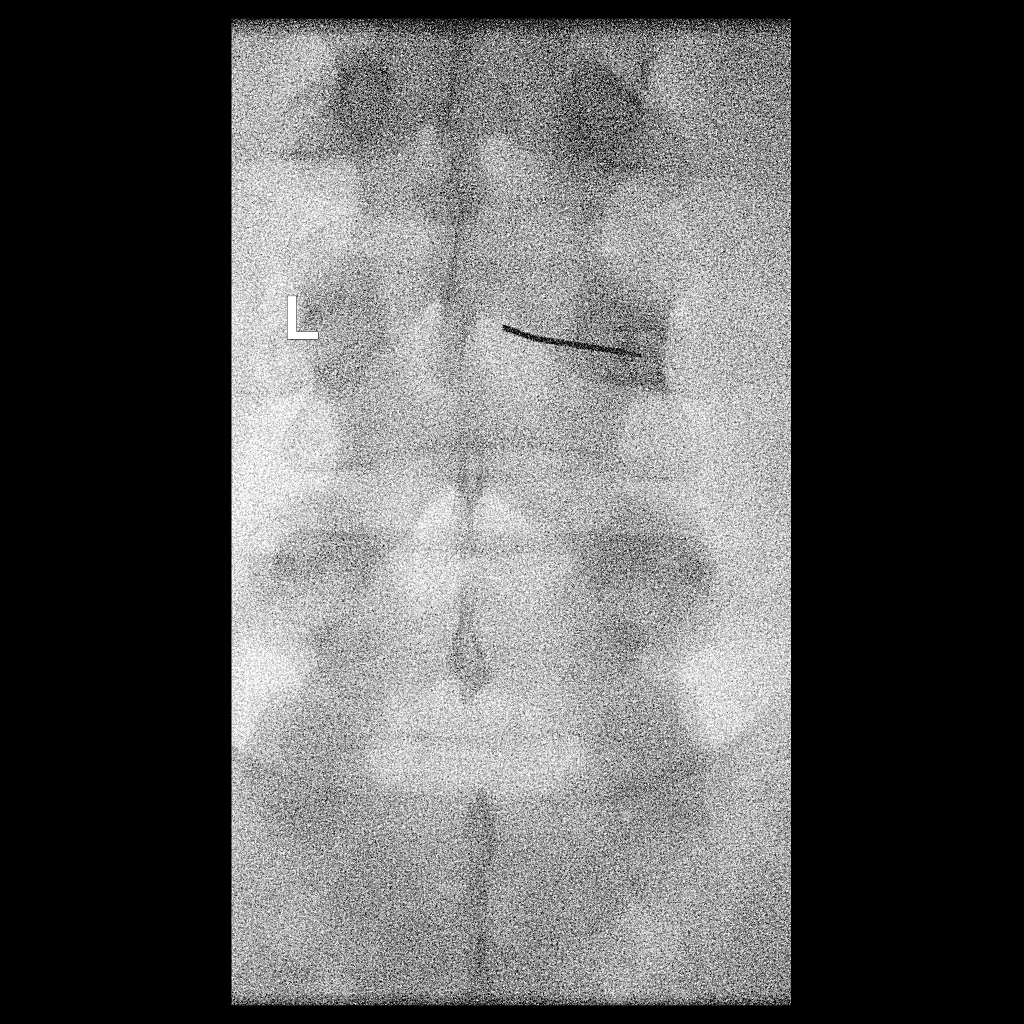

[1 of 1 positions shown; findings below may reference images not displayed]

FLUOROSCOPY:
Radiation Exposure Index (if provided by the fluoroscopic device):
1.8 mGy air kerma

PROCEDURE:
Informed consent was obtained from the patient prior to the
procedure, including potential complications of headache, allergy,
and pain. With the patient prone, the lower back was prepped with
Betadine. 1% Lidocaine was used for local anesthesia. Lumbar
puncture was performed at the L3-L4 level via a right interlaminar
approach using a 6 inch 20 gauge needle with return of clear,
colorless CSF with an opening pressure of 24 cm water. 15 ml of CSF
were obtained for laboratory studies. Closing pressure was 14 cm
water. The patient tolerated the procedure well and there were no
apparent complications.
IMPRESSION: Technically successful fluoroscopically guided lumbar puncture.

## 2022-08-20 ENCOUNTER — Ambulatory Visit (INDEPENDENT_AMBULATORY_CARE_PROVIDER_SITE_OTHER): Payer: BC Managed Care – PPO | Admitting: Family Medicine

## 2022-08-20 ENCOUNTER — Encounter (INDEPENDENT_AMBULATORY_CARE_PROVIDER_SITE_OTHER): Payer: Self-pay | Admitting: Family Medicine

## 2022-08-20 VITALS — BP 136/86 | HR 59 | Temp 98.3°F | Ht 69.0 in | Wt 296.0 lb

## 2022-08-20 DIAGNOSIS — Z6841 Body Mass Index (BMI) 40.0 and over, adult: Secondary | ICD-10-CM

## 2022-08-20 DIAGNOSIS — E559 Vitamin D deficiency, unspecified: Secondary | ICD-10-CM | POA: Diagnosis not present

## 2022-08-20 DIAGNOSIS — Z7985 Long-term (current) use of injectable non-insulin antidiabetic drugs: Secondary | ICD-10-CM

## 2022-08-20 DIAGNOSIS — E669 Obesity, unspecified: Secondary | ICD-10-CM

## 2022-08-20 DIAGNOSIS — Z9189 Other specified personal risk factors, not elsewhere classified: Secondary | ICD-10-CM

## 2022-08-20 MED ORDER — SAXENDA 18 MG/3ML ~~LOC~~ SOPN: PEN_INJECTOR | SUBCUTANEOUS | 0 refills | Status: DC

## 2022-08-20 MED ORDER — VITAMIN D (ERGOCALCIFEROL) 1.25 MG (50000 UNIT) PO CAPS
50000.0000 [IU] | ORAL_CAPSULE | ORAL | 0 refills | Status: DC
Start: 2022-08-20 — End: 2022-09-03

## 2022-08-26 NOTE — Progress Notes (Signed)
Chief Complaint:   OBESITY Ronald George is here to discuss his progress with his obesity treatment plan along with follow-up of his obesity related diagnoses. Ronald George is on the Category 3 Plan with lunch options and states he is following his eating plan approximately 75-80% of the time. Ronald George states he is walking 30 minutes 4 times per week.  Today's visit was #: 4 Starting weight: 306 lbs Starting date: 07/09/2022 Today's weight: 296 lbs Today's date: 08/20/2022 Total lbs lost to date: 10 Total lbs lost since last in-office visit: 0  Interim History: Ronald George is happy with his meal plan and his progress so far, even though he didn't lose weight this visit.  Subjective:   1. Vitamin D deficiency He is currently taking prescription vitamin D 50,000 IU each week. He denies nausea, vomiting or muscle weakness.  2. At risk for side effect of medication Ronald George is at risk for side effects of medication due to starting a new medication.  Assessment/Plan:  No orders of the defined types were placed in this encounter.   Medications Discontinued During This Encounter  Medication Reason   Semaglutide-Weight Management 0.25 MG/0.5ML SOAJ    Vitamin D, Ergocalciferol, (DRISDOL) 1.25 MG (50000 UNIT) CAPS capsule Reorder     Meds ordered this encounter  Medications   Liraglutide -Weight Management (SAXENDA) 18 MG/3ML SOPN    Sig: Inject 0.6 mg into the skin daily for 14 days, THEN 1.2 mg daily for 23 days.    Dispense:  6 mL    Refill:  0   Vitamin D, Ergocalciferol, (DRISDOL) 1.25 MG (50000 UNIT) CAPS capsule    Sig: Take 1 capsule (50,000 Units total) by mouth every 7 (seven) days.    Dispense:  4 capsule    Refill:  0     1. Vitamin D deficiency Low Vitamin D level contributes to fatigue and are associated with obesity, breast, and colon cancer. He agrees to continue to take prescription Vitamin D @50 ,000 IU every week and will follow-up for routine testing of Vitamin D, at  least 2-3 times per year to avoid over-replacement.  Refill- Vitamin D, Ergocalciferol, (DRISDOL) 1.25 MG (50000 UNIT) CAPS capsule; Take 1 capsule (50,000 Units total) by mouth every 7 (seven) days.  Dispense: 4 capsule; Refill: 0  2. At risk for side effect of medication Due to Ronald George's current conditions and medications, he is at a higher risk for drug side effect.  At least 9 minutes was spent on counseling him about these concerns today.  We discussed the benefits and potential risks of these medications, and all of patient's concerns were addressed and questions were answered.  he will call , or their PCP or other specialists who treat their conditions with medications, with any questions or concerns that may develop.    3. Obesity, current BMI 43.7 Ronald George is currently in the action stage of change. As such, his goal is to continue with weight loss efforts. He has agreed to the Category 3 Plan with lunch options.   Discontinue Wegovy due to shortage/unable to obtain and start Saxenda, as per below.. Start- Liraglutide -Weight Management (SAXENDA) 18 MG/3ML SOPN; Inject 0.6 mg into the skin daily for 14 days, THEN 1.2 mg daily for 23 days.  Dispense: 6 mL; Refill: 0  Exercise goals: For substantial health benefits, adults should do at least 150 minutes (2 hours and 30 minutes) a week of moderate-intensity, or 75 minutes (1 hour and 15 minutes) a week of  vigorous-intensity aerobic physical activity, or an equivalent combination of moderate- and vigorous-intensity aerobic activity. Aerobic activity should be performed in episodes of at least 10 minutes, and preferably, it should be spread throughout the week.  Behavioral modification strategies: increasing lean protein intake, decreasing simple carbohydrates, and planning for success.  Ronald George has agreed to follow-up with our clinic in 3 weeks. He was informed of the importance of frequent follow-up visits to maximize his success with  intensive lifestyle modifications for his multiple health conditions.   Objective:   Blood pressure 136/86, pulse (!) 59, temperature 98.3 F (36.8 C), height 5\' 9"  (1.753 m), weight 296 lb (134.3 kg), SpO2 96 %. Body mass index is 43.71 kg/m.  General: Cooperative, alert, well developed, in no acute distress. HEENT: Conjunctivae and lids unremarkable. Cardiovascular: Regular rhythm.  Lungs: Normal work of breathing. Neurologic: No focal deficits.   Lab Results  Component Value Date   CREATININE 1.10 07/09/2022   BUN 13 07/09/2022   NA 139 07/09/2022   K 4.3 07/09/2022   CL 101 07/09/2022   CO2 22 07/09/2022   Lab Results  Component Value Date   ALT 78 (H) 07/09/2022   AST 62 (H) 07/09/2022   ALKPHOS 84 07/09/2022   BILITOT 1.1 07/09/2022   Lab Results  Component Value Date   HGBA1C 5.4 07/09/2022   HGBA1C 5.4 02/25/2022   Lab Results  Component Value Date   INSULIN 27.2 (H) 07/09/2022   Lab Results  Component Value Date   TSH 2.090 07/09/2022   Lab Results  Component Value Date   CHOL 156 07/09/2022   HDL 38 (L) 07/09/2022   LDLCALC 98 07/09/2022   TRIG 107 07/09/2022   CHOLHDL 3.9 03/02/2020   Lab Results  Component Value Date   VD25OH 22.6 (L) 07/09/2022   Lab Results  Component Value Date   WBC 6.3 07/09/2022   HGB 16.1 07/09/2022   HCT 47.6 07/09/2022   MCV 84 07/09/2022   PLT 233 07/09/2022   Attestation Statements:   Reviewed by clinician on day of visit: allergies, medications, problem list, medical history, surgical history, family history, social history, and previous encounter notes.  I, 09/09/2022, BS, CMA, am acting as transcriptionist for Kyung Rudd, DO.   I have reviewed the above documentation for accuracy and completeness, and I agree with the above. [employer], D.O.  The 21st Century Cures Act was signed into law in 2016 which includes the topic of electronic health records.  This provides immediate access to  information in MyChart.  This includes consultation notes, operative notes, office notes, lab results and pathology reports.  If you have any questions about what you read please let 2017 know at your next visit so we can discuss your concerns and take corrective action if need be.  We are right here with you.

## 2022-09-01 ENCOUNTER — Other Ambulatory Visit: Payer: Self-pay | Admitting: Cardiology

## 2022-09-01 DIAGNOSIS — I491 Atrial premature depolarization: Secondary | ICD-10-CM

## 2022-09-01 DIAGNOSIS — R002 Palpitations: Secondary | ICD-10-CM

## 2022-09-03 ENCOUNTER — Encounter (INDEPENDENT_AMBULATORY_CARE_PROVIDER_SITE_OTHER): Payer: Self-pay | Admitting: Family Medicine

## 2022-09-03 ENCOUNTER — Ambulatory Visit (INDEPENDENT_AMBULATORY_CARE_PROVIDER_SITE_OTHER): Payer: BC Managed Care – PPO | Admitting: Family Medicine

## 2022-09-03 VITALS — BP 138/87 | HR 67 | Temp 98.8°F | Ht 69.0 in | Wt 296.0 lb

## 2022-09-03 DIAGNOSIS — E669 Obesity, unspecified: Secondary | ICD-10-CM | POA: Diagnosis not present

## 2022-09-03 DIAGNOSIS — E559 Vitamin D deficiency, unspecified: Secondary | ICD-10-CM

## 2022-09-03 DIAGNOSIS — Z9189 Other specified personal risk factors, not elsewhere classified: Secondary | ICD-10-CM

## 2022-09-03 DIAGNOSIS — Z6841 Body Mass Index (BMI) 40.0 and over, adult: Secondary | ICD-10-CM | POA: Diagnosis not present

## 2022-09-03 MED ORDER — VITAMIN D (ERGOCALCIFEROL) 1.25 MG (50000 UNIT) PO CAPS
50000.0000 [IU] | ORAL_CAPSULE | ORAL | 0 refills | Status: DC
  Filled 2022-09-03: qty 4, 28d supply, fill #0

## 2022-09-03 MED ORDER — SAXENDA 18 MG/3ML ~~LOC~~ SOPN
PEN_INJECTOR | SUBCUTANEOUS | 0 refills | Status: DC
  Filled 2022-09-03: qty 6, 37d supply, fill #0

## 2022-09-04 ENCOUNTER — Other Ambulatory Visit (HOSPITAL_COMMUNITY): Payer: Self-pay

## 2022-09-08 ENCOUNTER — Other Ambulatory Visit (HOSPITAL_COMMUNITY): Payer: Self-pay

## 2022-09-10 NOTE — Progress Notes (Signed)
Chief Complaint:   OBESITY Ronald George is here to discuss his progress with his obesity treatment plan along with follow-up of his obesity related diagnoses. Ronald George is on the Category 3 Plan with lunch options and states he is following his eating plan approximately 65% of the time. [place] states he is not currently exercising.  Today's visit was #: 5 Starting weight: 306 lbs Starting date: 07/09/2022 Today's weight: 297 lbs Today's date: 09/03/2022 Total lbs lost to date: 9 Total lbs lost since last in-office visit: +1  Interim History: Ronald George traveled more the past couple of weeks and ate out more. He eats on plan for lunch daily and half of the time, he skips breakfast or eats unhealthy. Dinner is the toughest meal to get in because he gets home from work late.  Subjective:   1. Vitamin D deficiency He is currently taking prescription vitamin D 50,000 IU each week. He denies nausea, vomiting or muscle weakness.  2. At risk for malnutrition Ronald George is at risk for malnutrition due to inadequate nutrients, with her only eating 1 meal on plan consistently.  3. Obesity, current BMI 43.7 Ronald George hasn't started [place] yet. He still can't find it at any pharmacies.  Assessment/Plan:  No orders of the defined types were placed in this encounter.   Medications Discontinued During This Encounter  Medication Reason   Liraglutide -Weight Management (SAXENDA) 18 MG/3ML SOPN Reorder   Vitamin D, Ergocalciferol, (DRISDOL) 1.25 MG (50000 UNIT) CAPS capsule Reorder     Meds ordered this encounter  Medications   Vitamin D, Ergocalciferol, (DRISDOL) 1.25 MG (50000 UNIT) CAPS capsule    Sig: Take 1 capsule (50,000 Units total) by mouth every 7 (seven) days.    Dispense:  4 capsule    Refill:  0   Liraglutide -Weight Management (SAXENDA) 18 MG/3ML SOPN    Sig: Inject 0.6 mg into the skin daily for 14 days, THEN 1.2 mg daily for 23 days.    Dispense:  6 mL    Refill:  0     1.  Vitamin D deficiency Low Vitamin D level contributes to fatigue and are associated with obesity, breast, and colon cancer. He agrees to continue to take prescription Vitamin D @50 ,000 IU every week and will follow-up for routine testing of Vitamin D, at least 2-3 times per year to avoid over-replacement.  Refill- Vitamin D, Ergocalciferol, (DRISDOL) 1.25 MG (50000 UNIT) CAPS capsule; Take 1 capsule (50,000 Units total) by mouth every 7 (seven) days.  Dispense: 4 capsule; Refill: 0  2. At risk for malnutrition Ronald George was given extensive malnutrition prevention education and counseling today of more than 10 minutes.  Counseled him that malnutrition refers to inappropriate nutrients or not the right balance of nutrients for optimal health.  Discussed with [name] that it is absolutely possible to be malnourished but yet obese.  Risk factors, including but not limited to, inappropriate dietary choices, difficulty with obtaining food due to physical or financial limitations, and various physical and mental health conditions were reviewed with Ronald George.   3. Obesity, current BMI 43.7 We will send a new script to Ronald George for [place] today. - Liraglutide -Weight Management (SAXENDA) 18 MG/3ML SOPN; Inject 0.6 mg into the skin daily for 14 days, THEN 1.2 mg daily for 23 days.  Dispense: 6 mL; Refill: 0  Ronald George is currently in the action stage of change. As such, his goal is to continue with weight loss efforts. He has agreed  to the Category 3 Plan with breakfast and lunch options. Switch dinner with lunch to get the proteins and calories from dinner meal and bring a sandwich to eat around 6-7 PM (lunch foods).   Very long discussion had with pt and fiance on how to meal prep and make foods exciting. Recipe packet I and II given to pt.  Exercise goals:  As is  Behavioral modification strategies: increasing lean protein intake, decreasing simple carbohydrates, meal planning and cooking  strategies, and planning for success.  Ronald George has agreed to follow-up with our clinic in 3 weeks. He was informed of the importance of frequent follow-up visits to maximize his success with intensive lifestyle modifications for his multiple health conditions.   Objective:   Blood pressure 138/87, pulse 67, temperature 98.8 F (37.1 C), height 5\' 9"  (1.753 m), weight 296 lb (134.3 kg), SpO2 97 %. Body mass index is 43.71 kg/m.  General: Cooperative, alert, well developed, in no acute distress. HEENT: Conjunctivae and lids unremarkable. Cardiovascular: Regular rhythm.  Lungs: Normal work of breathing. Neurologic: No focal deficits.   Lab Results  Component Value Date   CREATININE 1.10 07/09/2022   BUN 13 07/09/2022   NA 139 07/09/2022   K 4.3 07/09/2022   CL 101 07/09/2022   CO2 22 07/09/2022   Lab Results  Component Value Date   ALT 78 (H) 07/09/2022   AST 62 (H) 07/09/2022   ALKPHOS 84 07/09/2022   BILITOT 1.1 07/09/2022   Lab Results  Component Value Date   HGBA1C 5.4 07/09/2022   HGBA1C 5.4 02/25/2022   Lab Results  Component Value Date   INSULIN 27.2 (H) 07/09/2022   Lab Results  Component Value Date   TSH 2.090 07/09/2022   Lab Results  Component Value Date   CHOL 156 07/09/2022   HDL 38 (L) 07/09/2022   LDLCALC 98 07/09/2022   TRIG 107 07/09/2022   CHOLHDL 3.9 03/02/2020   Lab Results  Component Value Date   VD25OH 22.6 (L) 07/09/2022   Lab Results  Component Value Date   WBC 6.3 07/09/2022   HGB 16.1 07/09/2022   HCT 47.6 07/09/2022   MCV 84 07/09/2022   PLT 233 07/09/2022    Attestation Statements:   Reviewed by clinician on day of visit: allergies, medications, problem list, medical history, surgical history, family history, social history, and previous encounter notes.  I, 09/09/2022, BS, CMA, am acting as transcriptionist for Kyung Rudd, DO.   I have reviewed the above documentation for accuracy and completeness, and I  agree with the above. [employer], D.O.  The 21st Century Cures Act was signed into law in 2016 which includes the topic of electronic health records.  This provides immediate access to information in MyChart.  This includes consultation notes, operative notes, office notes, lab results and pathology reports.  If you have any questions about what you read please let 2017 know at your next visit so we can discuss your concerns and take corrective action if need be.  We are right here with you.

## 2022-09-11 ENCOUNTER — Other Ambulatory Visit (HOSPITAL_COMMUNITY): Payer: Self-pay

## 2022-09-12 ENCOUNTER — Other Ambulatory Visit (HOSPITAL_COMMUNITY): Payer: Self-pay

## 2022-09-17 DIAGNOSIS — H04123 Dry eye syndrome of bilateral lacrimal glands: Secondary | ICD-10-CM | POA: Diagnosis not present

## 2022-09-17 DIAGNOSIS — H0102B Squamous blepharitis left eye, upper and lower eyelids: Secondary | ICD-10-CM | POA: Diagnosis not present

## 2022-09-17 DIAGNOSIS — H0102A Squamous blepharitis right eye, upper and lower eyelids: Secondary | ICD-10-CM | POA: Diagnosis not present

## 2022-09-17 DIAGNOSIS — G932 Benign intracranial hypertension: Secondary | ICD-10-CM | POA: Diagnosis not present

## 2022-10-01 ENCOUNTER — Ambulatory Visit (INDEPENDENT_AMBULATORY_CARE_PROVIDER_SITE_OTHER): Payer: BC Managed Care – PPO | Admitting: Family Medicine

## 2022-10-01 ENCOUNTER — Telehealth: Payer: Self-pay | Admitting: Neurology

## 2022-10-01 ENCOUNTER — Encounter (INDEPENDENT_AMBULATORY_CARE_PROVIDER_SITE_OTHER): Payer: Self-pay | Admitting: Family Medicine

## 2022-10-01 ENCOUNTER — Telehealth (INDEPENDENT_AMBULATORY_CARE_PROVIDER_SITE_OTHER): Payer: BC Managed Care – PPO | Admitting: Neurology

## 2022-10-01 ENCOUNTER — Encounter: Payer: Self-pay | Admitting: Neurology

## 2022-10-01 VITALS — BP 138/90 | HR 68 | Temp 98.1°F | Ht 69.0 in | Wt 292.0 lb

## 2022-10-01 DIAGNOSIS — G932 Benign intracranial hypertension: Secondary | ICD-10-CM | POA: Diagnosis not present

## 2022-10-01 DIAGNOSIS — Z6841 Body Mass Index (BMI) 40.0 and over, adult: Secondary | ICD-10-CM | POA: Diagnosis not present

## 2022-10-01 DIAGNOSIS — E559 Vitamin D deficiency, unspecified: Secondary | ICD-10-CM

## 2022-10-01 DIAGNOSIS — E669 Obesity, unspecified: Secondary | ICD-10-CM

## 2022-10-01 NOTE — Progress Notes (Signed)
[facility]    Provider:  Dr Lucia GaskinsAhern Referring Provider: Dr. Marchelle Gearinghris Groat Primary Care Physician:  Loura PardonGarcia-Grider, Angela M, PA  CC:  iih   Virtual Visit via Video Note  I connected with Ronald George on 10/01/22 at  8:30 AM EDT by a video enabled telemedicine application and verified that I am speaking with the correct person using two identifiers.  Location: Patient: home Provider: office   I discussed the limitations of evaluation and management by telemedicine and the availability of in person appointments. The patient expressed understanding and agreed to proceed.  Follow Up Instructions:    I discussed the assessment and treatment plan with the patient. The patient was provided an opportunity to ask questions and all were answered. The patient agreed with the plan and demonstrated an understanding of the instructions.   The patient was advised to call back or seek an in-person evaluation if the symptoms worsen or if the condition fails to improve as anticipated.  I provided over 30 minutes of non-face-to-face time during this encounter.   Anson FretAhern, Liora Myles B, MD  Interval history 10/01/2022: He wastarted on Saxenda for weight loss and he is increasing no issues no side effects yet, he has had weight loss and goes to the weight loss center and is down 16 pounds, I had emailed his weight loss doctor and asked for her to expedite weight loss medications. His appointment with Dr. Dione BoozeGroat was stable per patient ( will request notes). He is doing well on the higher dose of the diamox. We had increase dthe diamox by 500mg  (so now he takes 100mg  at bedtime and 500mg  in the morning) and he is feeling better. He has slight darkness in his last eye, and Dr. Dione BoozeGroat said he has "perfect Vision". No headaches. A slight amount of pressure but overall asymptomatic on this dose. 98% asymptomatic just a little pressure behind the eyes may be ete fatigue even bc after staring at a  screen. Will keep on this dose and continue losing weight. Sees Dr. Dione BoozeGroat in December, see me in January.   Patient complains of symptoms per HPI as well as the following symptoms: eye pressure/fatigue . Pertinent negatives and positives per HPI. All others negative    Interval history July 30, 2022: I reviewed ophthalmology Dr. Laruth BouchardGroat's note from 07/16/2022,  He has discussed OCD with Dr. 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  He still has significant optic nerve head edema 2+ right eye 1+ left eye.  Taking 1000mg  Diamox at bedtime, will add 500mg  in the morning. Saw Dr. Dione BoozeGroat 07/16/2022 and still with significant ONH edema. Increasing diamox. Symptomatically he is feeling better, he has noticed vision loss in his left eye. [facility] Sees Dr. Dione BoozeGroat in September. We will see him in October. I will contact [facility] and inquire if they can put him on wegovy to expedite weight loss  Patient complains of symptoms per HPI as well as the following symptoms: vision loss . Pertinent negatives and positives per HPI. All others negative   05/28/2022: He saw Dr. Dione BoozeGroat, he is on 1000mg  diazepam at bedtime, back almost completely normal per Dr. Dione BoozeGroat. See Dr. Randie HeinzGreat July 19th. Will follow up with dr. Dione BoozeGroat in July and then with me in August. Go down to one pill of  the diamox due to considerable improvement. [facility]   02/2022: - MRI brain/MRV completed 03/06/2022: MRV head (without) demonstrating:Decreased signal in the superior sagittal sinus, left transverse and sigmoid sinus, and left internal jugular vein. This could be due to technical factors, hypoplasia or seen in association with idiopathic intracranial hypertension (pseudotumor cerebri). - MRI brain:  Stable, mild enlarged  bilateral optic nerve sheaths.   Patient complains of symptoms per HPI as well as the following symptoms: insomnia, poor mood . Pertinent negatives and positives per HPI. All others negative  02/25/2022: We have seen patient in the past for idiopathic intracranial hypertension, confirmed by imaging and lumbar puncture opening pressure 32 at [facility] imaging in 2019.  It appears he was lost to follow-up and recently saw Dr. Dione Booze with significant papilledema.   Dr. Dione Booze recommended therapeutic lumbar puncture.  I ordered the lumbar puncture,opening pressure was improved at 24. He was diagnosed with mild sleep apnea was not compliant. Discussed new implantable, may be a candidate. started having hazziness again to his left eye several months agp, started getting worse, fuzzy vision, got a little dark, his BP is elevated he is non compliant with his BP medications. He has not lost weight. He stopped the diamox several years ago after being on it and Dr. Dione Booze didn;t see any papilledema until recently. He went back recently and he had papilledema, started on diamox, we sent him for lp, he hasn't felt very bad. Just the vision in left eye. Vision improved since diamox. He has gained weight. No headaches. Improving. No other focal neurologic deficits, associated symptoms, inciting events or modifiable factors.  Patient complains of symptoms per HPI as well as the following symptoms: monocular vision loss . Pertinent negatives and positives per HPI. All others negative  IMPRESSION:  personally reviewed and agree with the following   MRI brain (with and without) demonstrating: - No intracranial mass, hemorrhage or acute findings.  - Of note slightly prominent / enlarged optic nerve sheaths are noted. Consider clinical correlation for idiopathic intracranial hypertension (pseudotumor cerebri).   HPI 01/2018:  Ronald George is a 41 y.o. male here as a referral from Dr. Kathrin Penner for numbness and  tinglingin face.  Past medical history anxiety and depression. Started 2 weeks ago. In Mid December he started feeling washed out, lethargic possibly worse after eating. Fatigue progressed since then. He saw his pcp and reported irregular heart rate and was started on propranolol. Propranolol helped with the heart palps. His dose was increased and Topamax was added for appetite. He is struggling with weight loss. Fatigue is worsening. Since Topamax he has tingling in the forehead. He stopped taking topamax and palpitations a week ago. He feels pressure in his temples. He has nausea. Dizziness makes him vomit. He can't focus on one object. He feels like he is spinning. His equilibrium is off. Can last 4 hours. No light sensitivity. No pulsating headache. Has had headaches behind the eyes, just dull an din the back of his head which are positional. No FHx migraines.  He wakes with these symptoms often. Snores heavily, wakes often. Never had a sleep test. No other focal neurologic deficits, associated symptoms, inciting events or modifiable factors.  Reviewed notes, labs and imaging from outside physicians, which showed:   Reviewed referring physician notes.  Patient presented yesterday for episodes of dizziness, numbness and tingling to his face and head.  He also had nausea and confusion with the episodes.  After the episodes he will go to sleep and then wake up feeling worn out.  Having episodes for a month.  He was started on propranolol for palpitations.  He would feel his heart race at times.  This was not necessarily associated with the numbness symptoms.  The palpitations have improved but the numbness episodes have not.  No chest pain with the episodes.  He does get blurred vision in the left eye with the episodes and some dots in the eye when it happens.  CT 02/17/18 showed No acute intracranial abnormalities including mass lesion or mass effect, hydrocephalus, extra-axial fluid collection, midline  shift, hemorrhage, or acute infarction, large ischemic events (personally reviewed images)   Social History   Socioeconomic History   Marital status: Significant Other    Spouse name: Renaldo Reel   Number of children: 0   Years of education: Not on file   Highest education level: Some college, no degree  Occupational History   Occupation: Research scientist (physical sciences)  Tobacco Use   Smoking status: Some Days   Smokeless tobacco: Never   Tobacco comments:    hookah occ 3 time in last week  Vaping Use   Vaping Use: Former   Quit date: 12/29/2017  Substance and Sexual Activity   Alcohol use: No    Alcohol/week: 0.0 standard drinks of alcohol   Drug use: No   Sexual activity: Not on file  Other Topics Concern   Not on file  Social History Narrative   Lives at home alone   Right handed   Drinks 1 cup of tea daily   Social Determinants of Health   Financial Resource Strain: Not on file  Food Insecurity: Not on file  Transportation Needs: Not on file  Physical Activity: Not on file  Stress: Not on file  Social Connections: Not on file  Intimate Partner Violence: Not on file    Family History  Problem Relation Age of Onset   Hypertension Mother    High Cholesterol Mother    Depression Mother    Anxiety disorder Mother    Obesity Mother    Hypertension Father    Sleep apnea Father    Migraines Neg Hx     Past Medical History:  Diagnosis Date   Acid reflux    Anxiety    Anxiety    Chest pain    Depression    Depression    Fatty liver    Gallbladder problem    Gout    Hypertension    Idiopathic intracranial hypertension    Moody    Palpitations    Sleep apnea    Sleepiness    SOB (shortness of breath)     Past Surgical History:  Procedure Laterality Date   CHOLECYSTECTOMY N/A 06/30/2016   Procedure: LAPAROSCOPIC CHOLECYSTECTOMY WITH INTRAOPERATIVE CHOLANGIOGRAM;  Surgeon: Abigail Miyamoto, MD;  Location: [facility] OR;  Service: General;  Laterality: N/A;    NO PAST SURGERIES     no anesthesia ever    Current Outpatient Medications  Medication Sig Dispense Refill   acetaZOLAMIDE ER (DIAMOX) 500 MG capsule Take 1000mg  at bedtime (2 pills) and 500mg  in the morning (1 pill). If doing well in 2-4 weeks increase to 1000mg  (2 pills) twice daily. 360 capsule 3   allopurinol (ZYLOPRIM) 100 MG tablet Take 100 mg by mouth daily.     colchicine 0.6 MG tablet Take 0.6 mg by mouth daily.     cyanocobalamin (VITAMIN B12) 500 MCG  tablet Take 1 tablet (500 mcg total) by mouth daily.     ibuprofen (ADVIL) 200 MG tablet Take 200 mg by mouth as needed.     Liraglutide -Weight Management (SAXENDA) 18 MG/3ML SOPN Inject 0.6 mg into the skin daily for 14 days, THEN 1.2 mg daily for 23 days. 6 mL 0   metoprolol succinate (TOPROL-XL) 25 MG 24 hr tablet TAKE 1 TABLET(25 MG) BY MOUTH DAILY 90 tablet 0   pantoprazole (PROTONIX) 40 MG tablet TAKE 1 TABLET(40 MG) BY MOUTH DAILY 30 tablet 0   traZODone (DESYREL) 100 MG tablet Take 1/2 to 1 pill at bedtime. 30 tablet 6   Vitamin D, Ergocalciferol, (DRISDOL) 1.25 MG (50000 UNIT) CAPS capsule Take 1 capsule (50,000 Units total) by mouth every 7 (seven) days. 4 capsule 0   No current facility-administered medications for this visit.   Facility-Administered Medications Ordered in Other Visits  Medication Dose Route Frequency Provider Last Rate Last Admin   gadopentetate dimeglumine (MAGNEVIST) injection 20 mL  20 mL Intravenous Once PRN Anson Fret, MD        Allergies as of 10/01/2022 - Review Complete 10/01/2022  Allergen Reaction Noted   Penicillins  04/17/2015    Vitals: There were no vitals taken for this visit. Last Weight:  Wt Readings from Last 1 Encounters:  09/03/22 296 lb (134.3 kg)   Last Height:   Ht Readings from Last 1 Encounters:  09/03/22 5\' 9"  (1.753 m)    Physical exam: Exam: Gen: NAD, conversant      CV: Could not perform over Web Video. Denies palpitations or chest pain or SOB. VS:  Breathing at a normal rate. Weight appears obese. Not febrile. Eyes: Conjunctivae clear without exudates or hemorrhage  Neuro: Detailed Neurologic Exam  Speech:    Speech is normal; fluent and spontaneous with normal comprehension.  Cognition:    The patient is oriented to person, place, and time;     recent and remote memory intact;     language fluent;     normal attention, concentration,     fund of knowledge Cranial Nerves:    Visual fields are full to finger confrontation. Extraocular movements are intact.  The face is symmetric with normal sensation. The palate elevates in the midline. Hearing intact. Voice is normal. Shoulder shrug is normal. The tongue has normal motion without fasciculations.   Motor Observation:   no involuntary movements noted. Tone:    Appears normal  Posture:    Posture is normal. normal erect    Strength:    Strength is anti-gravity and symmetric in the upper and lower limbs.      Sensation: intact to LT       PRIOR EXAMINATION: Physical exam: Exam: Gen: NAD, conversant      CV: attempted, Could not perform over Web Video. Denies palpitations or chest pain or SOB. VS: Breathing at a normal rate. Weight appears obese. Not febrile. Eyes: Conjunctivae clear without exudates or hemorrhage  Neuro: Detailed Neurologic Exam  Speech:    Speech is normal; fluent and spontaneous with normal comprehension.  Cognition:    The patient is oriented to person, place, and time;     recent and remote memory intact;     language fluent;     normal attention, concentration,     fund of knowledge Cranial Nerves:    The pupils are equal, round, and reactive to light. Attempted, Cannot perform fundoscopic exam. Visual fields are full to finger confrontation. (  Prior Left  1+> right 2+ papilledema) +APD OS. Extraocular movements are intact.  The face is symmetric with normal sensation. The palate elevates in the midline. Hearing intact. Voice is normal.  Shoulder shrug is normal. The tongue has normal motion without fasciculations.   Coordination:    Normal finger to nose  Gait:    Normal native gait  Motor Observation:   no involuntary movements noted. Tone:    Appears normal  Posture:    Posture is normal. normal erect    Strength:    Strength is anti-gravity and symmetric in the upper and lower limbs.      Sensation: intact to LT       Assessment/Plan:  41 year old with PMHx IDIOPATHIC INTRACRANIAL HYPERTENSION. We have seen patient in the past for idiopathic intracranial hypertension, confirmed by imaging and lumbar puncture opening pressure 32 at [facility] in 2019.  It appears he was lost to follow-up and recently saw Dr. Katy Fitch with significant papilledema.   Dr. Katy Fitch recommended therapeutic lumbar puncture.  I ordered the lumbar puncture,opening pressure was improved at 24. He was diagnosed with mild sleep apnea was not compliant.Saw him via video today, asked for in office visits  Doing better after we added 500mg  of diamox onto his 1000mg , continue 1500mg  diamox a day, we wrote prescription to increase to 1000mg  bid as tolerated and needed, he is improved so we will hold here until he sees dr Katy Fitch in January and we may increase diamox further based on exam and weight loss, per patient last note by Dr. Katy Fitch was "stable" but see below sounds improved but still with papilledema and optic atrophy, he continues to lose weight and is now on saxenda, will get Dr. Zenia Resides notes from appointment last month. He is asymptomatic. Vision stable per patient.   Dr Zenia Resides notes when requested, received fax today, last visit July 16, 2022 (see below).  Pupils were round brisk no RAPD 5 mm bilaterally, motility full, adnexa normal, conjunctiva and sclera without injection.  Cornea clear.  Anterior chamber deep and quiet.  Iris normal-appearing.  Findings OD: 2+ edema in the right eye.  1+ edema in the left eye.  Today improved compared  to prior study, normal ganglion cell complex, retinal nerve fiber layer with moderate edema, good fixation.  Findings OS: Improved compared to prior study but discussed nerve fibers dying off, normal ganglion cell complex, retinal nerve fiber layer edema; pallor.  Good fixation.  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    I reviewed ophthalmology Dr. Zenia Resides note from 07/16/2022,  patient has discussed OCD with Dr. 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  He still has significant optic nerve head edema 2+ right eye 1+ left eye per ophtho.  - MRI brain/MRV completed 03/06/2022: MRV head (without) demonstrating:Decreased signal in the superior sagittal sinus, left transverse and sigmoid sinus, and left internal jugular vein. This could be due to technical factors, hypoplasia or seen in association with idiopathic intracranial hypertension (pseudotumor cerebri). - MRI brain:  Stable, mild enlarged bilateral optic nerve sheaths.   - Sleep evaluation for OSA: he did not tolerate, encouraged him to follow up, has gained weight, we  discussed the new treatment but he prefers to continue to lose weight and that is what he is concentrating on.   -discussed importance of weight loss, now [facility] and on saxenda  - f/u with pcp for HTN and medical management  - Trazodone at bedtime for sleep may also help with mood (Is an ssri)  Cc: Dr. Kathrin Penner, Dr. Marchelle Gearing  Naomie Dean, MD  [facility] [address] [place], [place] [address]  Phone [phone] Fax [phone]

## 2022-10-01 NOTE — Telephone Encounter (Signed)
Can you schedule an appointment mid January for him with me. It can be video again this time because he is seeing ophthalmology right before comg to me so I'll have those exam notes thank you.

## 2022-10-01 NOTE — Telephone Encounter (Signed)
LVM asking pt to call back to schedule f/u 

## 2022-10-13 MED ORDER — SAXENDA 18 MG/3ML ~~LOC~~ SOPN: 1.2000 mg | PEN_INJECTOR | Freq: Every day | SUBCUTANEOUS | 0 refills | Status: DC

## 2022-10-13 MED ORDER — VITAMIN D (ERGOCALCIFEROL) 1.25 MG (50000 UNIT) PO CAPS: 50000.0000 [IU] | ORAL_CAPSULE | ORAL | 0 refills | Status: DC

## 2022-10-27 NOTE — Progress Notes (Signed)
Chief Complaint:   OBESITY Ronald George is here to discuss his progress with his obesity treatment plan along with follow-up of his obesity related diagnoses. Ronald George is on the Category 3 Plan with breakfast and lunch options and states he is following his eating plan approximately 75-80% of the time. Ronald George states he is walking 20-30 minutes 3 times per week.  Today's visit was #: 6 Starting weight: 306 lbs Starting date: 07/09/2022 Today's weight: 292 lbs Today's date: 10/01/2022 Total lbs lost to date: 14 Total lbs lost since last in-office visit: 5  Interim History: Ronald George is on Saxenda 1.2 mg. He is not hungry but can eat everything he's supposed to on meal plan. Pt denies cravings. He is here with his fiance, Ronald George, today. She has been meal prep and planning with him.  Subjective:  No orders of the defined types were placed in this encounter.   Medications Discontinued During This Encounter  Medication Reason   Liraglutide -Weight Management (SAXENDA) 18 MG/3ML SOPN    Vitamin D, Ergocalciferol, (DRISDOL) 1.25 MG (50000 UNIT) CAPS capsule Reorder     Meds ordered this encounter  Medications   DISCONTD: Vitamin D, Ergocalciferol, (DRISDOL) 1.25 MG (50000 UNIT) CAPS capsule    Sig: Take 1 capsule (50,000 Units total) by mouth every 7 (seven) days.    Dispense:  4 capsule    Refill:  0   Liraglutide -Weight Management (SAXENDA) 18 MG/3ML SOPN    Sig: Inject 1.2 mg into the skin daily.    Dispense:  6 mL    Refill:  0    Patient will have discount coupon, please include novofine needles QS     1. Vitamin D deficiency Ronald George is tolerating medication(s) well without side effects.  Medication compliance is good as patient endorses taking it as prescribed.  The patient denies additional concerns regarding this condition. Medication: Ergocalciferol  Assessment/Plan:   1. Vitamin D deficiency Low Vitamin D level contributes to fatigue and are associated with  obesity, breast, and colon cancer. He agrees to continue to take prescription Vitamin D @50 ,000 IU every week and will follow-up for routine testing of Vitamin D, at least 2-3 times per year to avoid over-replacement.  Refill- Vitamin D, Ergocalciferol, (DRISDOL) 1.25 MG (50000 UNIT) CAPS capsule; Take 1 capsule (50,000 Units total) by mouth every 7 (seven) days.  Dispense: 4 capsule; Refill: 0  2. Obesity, current BMI 43.2 Ronald George is currently in the action stage of change. As such, his goal is to continue with weight loss efforts. He has agreed to the Category 3 Plan with breakfast and lunch options.   Refill- Liraglutide -Weight Management (SAXENDA) 18 MG/3ML SOPN; Inject 1.2 mg into the skin daily.  Dispense: 6 mL; Refill: 0  Exercise goals:  As is- 30 minutes 3-5 days a week  Behavioral modification strategies: decreasing eating out and no skipping meals.  Ronald George has agreed to follow-up with our clinic in 3 weeks. He was informed of the importance of frequent follow-up visits to maximize his success with intensive lifestyle modifications for his multiple health conditions.   Objective:   Blood pressure (!) 138/90, pulse 68, temperature 98.1 F (36.7 C), height 5\' 9"  (1.753 m), weight 292 lb (132.5 kg), SpO2 97 %. Body mass index is 43.12 kg/m.  General: Cooperative, alert, well developed, in no acute distress. HEENT: Conjunctivae and lids unremarkable. Cardiovascular: Regular rhythm.  Lungs: Normal work of breathing. Neurologic: No focal deficits.   Lab Results  Component Value Date   CREATININE 1.10 07/09/2022   BUN 13 07/09/2022   NA 139 07/09/2022   K 4.3 07/09/2022   CL 101 07/09/2022   CO2 22 07/09/2022   Lab Results  Component Value Date   ALT 78 (H) 07/09/2022   AST 62 (H) 07/09/2022   ALKPHOS 84 07/09/2022   BILITOT 1.1 07/09/2022   Lab Results  Component Value Date   HGBA1C 5.4 07/09/2022   HGBA1C 5.4 02/25/2022   Lab Results  Component Value Date    INSULIN 27.2 (H) 07/09/2022   Lab Results  Component Value Date   TSH 2.090 07/09/2022   Lab Results  Component Value Date   CHOL 156 07/09/2022   HDL 38 (L) 07/09/2022   LDLCALC 98 07/09/2022   TRIG 107 07/09/2022   CHOLHDL 3.9 03/02/2020   Lab Results  Component Value Date   VD25OH 22.6 (L) 07/09/2022   Lab Results  Component Value Date   WBC 6.3 07/09/2022   HGB 16.1 07/09/2022   HCT 47.6 07/09/2022   MCV 84 07/09/2022   PLT 233 07/09/2022    Attestation Statements:   Reviewed by clinician on day of visit: allergies, medications, problem list, medical history, surgical history, family history, social history, and previous encounter notes.  I, Kyung Rudd, BS, CMA, am acting as transcriptionist for [employer], DO.   I have reviewed the above documentation for accuracy and completeness, and I agree with the above. Carlye Grippe, D.O.  The 21st Century Cures Act was signed into law in 2016 which includes the topic of electronic health records.  This provides immediate access to information in MyChart.  This includes consultation notes, operative notes, office notes, lab results and pathology reports.  If you have any questions about what you read please let us know at your next visit so we can discuss your concerns and take corrective action if need be.  We are right here with you.

## 2022-10-29 ENCOUNTER — Ambulatory Visit (INDEPENDENT_AMBULATORY_CARE_PROVIDER_SITE_OTHER): Payer: BC Managed Care – PPO | Admitting: Family Medicine

## 2022-10-29 ENCOUNTER — Encounter (INDEPENDENT_AMBULATORY_CARE_PROVIDER_SITE_OTHER): Payer: Self-pay | Admitting: Family Medicine

## 2022-10-29 VITALS — BP 148/82 | HR 67 | Temp 99.2°F | Ht 69.0 in | Wt 293.0 lb

## 2022-10-29 DIAGNOSIS — E669 Obesity, unspecified: Secondary | ICD-10-CM

## 2022-10-29 DIAGNOSIS — E559 Vitamin D deficiency, unspecified: Secondary | ICD-10-CM | POA: Diagnosis not present

## 2022-10-29 DIAGNOSIS — Z6841 Body Mass Index (BMI) 40.0 and over, adult: Secondary | ICD-10-CM

## 2022-10-29 DIAGNOSIS — K219 Gastro-esophageal reflux disease without esophagitis: Secondary | ICD-10-CM

## 2022-10-29 MED ORDER — VITAMIN D (ERGOCALCIFEROL) 1.25 MG (50000 UNIT) PO CAPS: 50000.0000 [IU] | ORAL_CAPSULE | ORAL | 0 refills | Status: DC

## 2022-11-08 NOTE — Progress Notes (Signed)
Chief Complaint:   OBESITY Ronald George is here to discuss his progress with his obesity treatment plan along with follow-up of his obesity related diagnoses. Ronald George is on the Category 3 Plan with breakfast and lunch options and states he is following his eating plan approximately 70% of the time. Ronald George states he is walking 20 minutes 3-4 times per week.  Today's visit was #: 7 Starting weight: 306 lbs Starting date: 07/09/2022 Today's weight: 293 lbs Today's date: 10/29/2022 Total lbs lost to date: 13 Total lbs lost since last in-office visit: +1  Interim History: October was a very stressful month at work for Ronald George. He is here with his fiance. Pt denies issues with meal plan, but he is doing some stress eating.  Subjective:   1. Vitamin D deficiency He is currently taking prescription vitamin D 50,000 IU each week. He denies nausea, vomiting or muscle weakness.  2. Gastroesophageal reflux disease, unspecified whether esophagitis present Ronald George's symptoms have resolved with weight loss. He has no need for meds any longer.  Assessment/Plan:  No orders of the defined types were placed in this encounter.   Medications Discontinued During This Encounter  Medication Reason   Vitamin D, Ergocalciferol, (DRISDOL) 1.25 MG (50000 UNIT) CAPS capsule Reorder     Meds ordered this encounter  Medications   Vitamin D, Ergocalciferol, (DRISDOL) 1.25 MG (50000 UNIT) CAPS capsule    Sig: Take 1 capsule (50,000 Units total) by mouth every 7 (seven) days.    Dispense:  4 capsule    Refill:  0     1. Vitamin D deficiency Low Vitamin D level contributes to fatigue and are associated with obesity, breast, and colon cancer. He agrees to continue to take prescription Vitamin D @50 ,000 IU every week and will follow-up for routine testing of Vitamin D, at least 2-3 times per year to avoid over-replacement.  Refill- Vitamin D, Ergocalciferol, (DRISDOL) 1.25 MG (50000 UNIT) CAPS capsule; Take  1 capsule (50,000 Units total) by mouth every 7 (seven) days.  Dispense: 4 capsule; Refill: 0  2. Gastroesophageal reflux disease, unspecified whether esophagitis present Continue to avoid trigger foods. Continue prudent nutritional plan and weight loss.  3. Obesity, current BMI 43.4 Ronald George is currently in the action stage of change. As such, his goal is to continue with weight loss efforts. He has agreed to the Category 3 Plan with breakfast and lunch options.   Continue Saxenda.  Exercise goals:  As is  Behavioral modification strategies: meal planning and cooking strategies and keeping healthy foods in the home.  Ronald George has agreed to follow-up with our clinic in 2-3 weeks. He was informed of the importance of frequent follow-up visits to maximize his success with intensive lifestyle modifications for his multiple health conditions. Pr will come fasting for labs.  Objective:   Blood pressure (!) 148/82, pulse 67, temperature 99.2 F (37.3 C), height 5\' 9"  (1.753 m), weight 293 lb (132.9 kg), SpO2 98 %. Body mass index is 43.27 kg/m.  General: Cooperative, alert, well developed, in no acute distress. HEENT: Conjunctivae and lids unremarkable. Cardiovascular: Regular rhythm.  Lungs: Normal work of breathing. Neurologic: No focal deficits.   Lab Results  Component Value Date   CREATININE 1.10 07/09/2022   BUN 13 07/09/2022   NA 139 07/09/2022   K 4.3 07/09/2022   CL 101 07/09/2022   CO2 22 07/09/2022   Lab Results  Component Value Date   ALT 78 (H) 07/09/2022   AST 62 (H)  07/09/2022   ALKPHOS 84 07/09/2022   BILITOT 1.1 07/09/2022   Lab Results  Component Value Date   HGBA1C 5.4 07/09/2022   HGBA1C 5.4 02/25/2022   Lab Results  Component Value Date   INSULIN 27.2 (H) 07/09/2022   Lab Results  Component Value Date   TSH 2.090 07/09/2022   Lab Results  Component Value Date   CHOL 156 07/09/2022   HDL 38 (L) 07/09/2022   LDLCALC 98 07/09/2022   TRIG 107  07/09/2022   CHOLHDL 3.9 03/02/2020   Lab Results  Component Value Date   VD25OH 22.6 (L) 07/09/2022   Lab Results  Component Value Date   WBC 6.3 07/09/2022   HGB 16.1 07/09/2022   HCT 47.6 07/09/2022   MCV 84 07/09/2022   PLT 233 07/09/2022    Attestation Statements:   Reviewed by clinician on day of visit: allergies, medications, problem list, medical history, surgical history, family history, social history, and previous encounter notes.  I, Kathlene November, BS, CMA, am acting as transcriptionist for [employer], DO.   I have reviewed the above documentation for accuracy and completeness, and I agree with the above. [name], D.O.  The [place] was signed into law in 2016 which includes the topic of electronic health records.  This provides immediate access to information in MyChart.  This includes consultation notes, operative notes, office notes, lab results and pathology reports.  If you have any questions about what you read please let us know at your next visit so we can discuss your concerns and take corrective action if need be.  We are right here with you.

## 2022-11-12 ENCOUNTER — Ambulatory Visit (INDEPENDENT_AMBULATORY_CARE_PROVIDER_SITE_OTHER): Payer: BC Managed Care – PPO | Admitting: Family Medicine

## 2022-12-03 ENCOUNTER — Encounter (INDEPENDENT_AMBULATORY_CARE_PROVIDER_SITE_OTHER): Payer: Self-pay | Admitting: Family Medicine

## 2022-12-03 ENCOUNTER — Ambulatory Visit (INDEPENDENT_AMBULATORY_CARE_PROVIDER_SITE_OTHER): Payer: BC Managed Care – PPO | Admitting: Family Medicine

## 2022-12-03 VITALS — BP 134/92 | HR 61 | Temp 98.0°F | Ht 69.0 in | Wt 288.6 lb

## 2022-12-03 DIAGNOSIS — E669 Obesity, unspecified: Secondary | ICD-10-CM | POA: Diagnosis not present

## 2022-12-03 DIAGNOSIS — I10 Essential (primary) hypertension: Secondary | ICD-10-CM

## 2022-12-03 DIAGNOSIS — E538 Deficiency of other specified B group vitamins: Secondary | ICD-10-CM

## 2022-12-03 DIAGNOSIS — E559 Vitamin D deficiency, unspecified: Secondary | ICD-10-CM

## 2022-12-03 DIAGNOSIS — Z6841 Body Mass Index (BMI) 40.0 and over, adult: Secondary | ICD-10-CM

## 2022-12-15 ENCOUNTER — Telehealth (INDEPENDENT_AMBULATORY_CARE_PROVIDER_SITE_OTHER): Payer: Self-pay

## 2022-12-15 NOTE — Telephone Encounter (Signed)
Your prior authorization for Ronald George has been approved!  Message from plan: Effective from 12/15/2022 through 04/19/2023.  Ref #:Key: [id number]

## 2022-12-17 DIAGNOSIS — E538 Deficiency of other specified B group vitamins: Secondary | ICD-10-CM | POA: Insufficient documentation

## 2022-12-17 DIAGNOSIS — E559 Vitamin D deficiency, unspecified: Secondary | ICD-10-CM | POA: Insufficient documentation

## 2022-12-17 DIAGNOSIS — I10 Essential (primary) hypertension: Secondary | ICD-10-CM | POA: Insufficient documentation

## 2022-12-21 NOTE — Progress Notes (Signed)
Chief Complaint:   OBESITY Ronald George is here to discuss his progress with his obesity treatment plan along with follow-up of his obesity related diagnoses. Ronald George is on the Category 3 Plan with breakfast and lunch options and states he is following his eating plan approximately 75% of the time. Ronald George states he is walking 20-30 minutes 3-4 times per week.  Today's visit was #: 8 Starting weight: 306 LBS Starting date: 07/09/2022 Today's weight: 288 LBS Today's date: 12/03/2022 Total lbs lost to date: 18 LBS Total lbs lost since last in-office visit: 5 LBS  Interim History: Recently got engaged and traveled.  Still manage his moods despite holiday and other celebrations.  Subjective:   1. Vitamin B12 deficiency Patient is on B12 500 mcg OTC, no issues.  His energy is improving.  2. Vitamin D deficiency He is currently taking prescription vitamin D 50,000 IU each week. He denies nausea, vomiting or muscle weakness.  3. Essential hypertension Idiopathic intracranial hypertension.  He is on metoprolol.  Blood pressure 134/92 and slightly increased.  Assessment/Plan:  No orders of the defined types were placed in this encounter.   There are no discontinued medications.   No orders of the defined types were placed in this encounter.    1. Vitamin B12 deficiency The diagnosis was reviewed with the patient. Counseling provided today, see below. We will continue to monitor. Orders and follow up as documented in patient record.  Counseling The body needs vitamin B12: to make red blood cells; to make DNA; and to help the nerves work properly so they can carry messages from the brain to the body.  The main causes of vitamin B12 deficiency include dietary deficiency, digestive diseases, pernicious anemia, and having a surgery in which part of the stomach or small intestine is removed.  Certain medicines can make it harder for the body to absorb vitamin B12. These medicines include:  heartburn medications; some antibiotics; some medications used to treat diabetes, gout, and high cholesterol.  In some cases, there are no symptoms of this condition. If the condition leads to anemia or nerve damage, various symptoms can occur, such as weakness or fatigue, shortness of breath, and numbness or tingling in your hands and feet.   Treatment:  May include taking vitamin B12 supplements.  Avoid alcohol.  Eat lots of healthy foods that contain vitamin B12: Beef, pork, chicken, [place], and organ meats, such as liver.  Seafood: This includes clams, rainbow trout, salmon, tuna, and haddock. Eggs.  Cereal and dairy products that are fortified: This means that vitamin B12 has been added to the food.   2. Vitamin D deficiency Low Vitamin D level contributes to fatigue and are associated with obesity, breast, and colon cancer. He agrees to continue to take prescription Vitamin D @50 ,000 IU every week and will follow-up for routine testing of Vitamin D, at least 2-3 times per year to avoid over-replacement.  Refill-vitamin D 1 capsule every 7 days #4,  0 refills  3. Essential hypertension Continue medication, and management per neurology and PCP.  Decrease salt intake and continue PNP and weight loss.  4. Obesity, current BMI 42.6 Continue 1.2 mg Saxenda daily.  She is tolerating well and reminded patient of importance of PNP and not skipping meals or proteins.  Ronald George is currently in the action stage of change. As such, his goal is to continue with weight loss efforts. He has agreed to the Category 3 Plan and lunch options.   Exercise  goals:  As is.   Behavioral modification strategies: holiday eating strategies  and celebration eating strategies.  Ronald George has agreed to follow-up with our clinic in 4-5 weeks. He was informed of the importance of frequent follow-up visits to maximize his success with intensive lifestyle modifications for his multiple health conditions.   Objective:    Blood pressure (!) 134/92, pulse 61, temperature 98 F (36.7 C), height 5\' 9"  (1.753 m), weight 288 lb 9.6 oz (130.9 kg), SpO2 99 %. Body mass index is 42.62 kg/m.  General: Cooperative, alert, well developed, in no acute distress. HEENT: Conjunctivae and lids unremarkable. Cardiovascular: Regular rhythm.  Lungs: Normal work of breathing. Neurologic: No focal deficits.   Lab Results  Component Value Date   CREATININE 1.10 07/09/2022   BUN 13 07/09/2022   NA 139 07/09/2022   K 4.3 07/09/2022   CL 101 07/09/2022   CO2 22 07/09/2022   Lab Results  Component Value Date   ALT 78 (H) 07/09/2022   AST 62 (H) 07/09/2022   ALKPHOS 84 07/09/2022   BILITOT 1.1 07/09/2022   Lab Results  Component Value Date   HGBA1C 5.4 07/09/2022   HGBA1C 5.4 02/25/2022   Lab Results  Component Value Date   INSULIN 27.2 (H) 07/09/2022   Lab Results  Component Value Date   TSH 2.090 07/09/2022   Lab Results  Component Value Date   CHOL 156 07/09/2022   HDL 38 (L) 07/09/2022   LDLCALC 98 07/09/2022   TRIG 107 07/09/2022   CHOLHDL 3.9 03/02/2020   Lab Results  Component Value Date   VD25OH 22.6 (L) 07/09/2022   Lab Results  Component Value Date   WBC 6.3 07/09/2022   HGB 16.1 07/09/2022   HCT 47.6 07/09/2022   MCV 84 07/09/2022   PLT 233 07/09/2022   No results found for: "IRON", "TIBC", "FERRITIN"  Attestation Statements:   Reviewed by clinician on day of visit: allergies, medications, problem list, medical history, surgical history, family history, social history, and previous encounter notes.  I, 09/09/2022, RMA, am acting as [employer] for [employer] manager, DO.   I have reviewed the above documentation for accuracy and completeness, and I agree with the above. [employer], D.O.  The 21st Century Cures Act was signed into law in 2016 which includes the topic of electronic health records.  This provides immediate access to information in MyChart.  This  includes consultation notes, operative notes, office notes, lab results and pathology reports.  If you have any questions about what you read please let 2017 know at your next visit so we can discuss your concerns and take corrective action if need be.  We are right here with you.

## 2022-12-29 MED ORDER — VITAMIN D (ERGOCALCIFEROL) 1.25 MG (50000 UNIT) PO CAPS
50000.0000 [IU] | ORAL_CAPSULE | ORAL | 0 refills | Status: DC
Start: 2022-12-29 — End: 2023-02-11

## 2023-01-07 ENCOUNTER — Ambulatory Visit (INDEPENDENT_AMBULATORY_CARE_PROVIDER_SITE_OTHER): Payer: BC Managed Care – PPO | Admitting: Family Medicine

## 2023-01-07 ENCOUNTER — Encounter (INDEPENDENT_AMBULATORY_CARE_PROVIDER_SITE_OTHER): Payer: Self-pay | Admitting: Family Medicine

## 2023-01-07 VITALS — BP 137/91 | HR 77 | Temp 98.7°F | Ht 69.0 in | Wt 282.6 lb

## 2023-01-07 DIAGNOSIS — E786 Lipoprotein deficiency: Secondary | ICD-10-CM

## 2023-01-07 DIAGNOSIS — R051 Acute cough: Secondary | ICD-10-CM | POA: Diagnosis not present

## 2023-01-07 DIAGNOSIS — E88819 Insulin resistance, unspecified: Secondary | ICD-10-CM | POA: Diagnosis not present

## 2023-01-07 DIAGNOSIS — E538 Deficiency of other specified B group vitamins: Secondary | ICD-10-CM

## 2023-01-07 DIAGNOSIS — E559 Vitamin D deficiency, unspecified: Secondary | ICD-10-CM | POA: Diagnosis not present

## 2023-01-07 DIAGNOSIS — Z6841 Body Mass Index (BMI) 40.0 and over, adult: Secondary | ICD-10-CM | POA: Diagnosis not present

## 2023-01-07 DIAGNOSIS — K760 Fatty (change of) liver, not elsewhere classified: Secondary | ICD-10-CM | POA: Diagnosis not present

## 2023-01-07 DIAGNOSIS — E669 Obesity, unspecified: Secondary | ICD-10-CM

## 2023-01-08 LAB — COMPREHENSIVE METABOLIC PANEL
ALT: 24 IU/L (ref 0–44)
AST: 23 IU/L (ref 0–40)
Albumin/Globulin Ratio: 1.3 (ref 1.2–2.2)
Albumin: 4.1 g/dL (ref 4.1–5.1)
Alkaline Phosphatase: 75 IU/L (ref 44–121)
BUN/Creatinine Ratio: 10 (ref 9–20)
BUN: 10 mg/dL (ref 6–24)
Bilirubin Total: 0.8 mg/dL (ref 0.0–1.2)
CO2: 23 mmol/L (ref 20–29)
Calcium: 9.3 mg/dL (ref 8.7–10.2)
Chloride: 102 mmol/L (ref 96–106)
Creatinine, Ser: 1.02 mg/dL (ref 0.76–1.27)
Globulin, Total: 3.2 g/dL (ref 1.5–4.5)
Glucose: 86 mg/dL (ref 70–99)
Potassium: 4.3 mmol/L (ref 3.5–5.2)
Sodium: 140 mmol/L (ref 134–144)
Total Protein: 7.3 g/dL (ref 6.0–8.5)
eGFR: 94 mL/min/{1.73_m2} (ref >59)

## 2023-01-08 LAB — LIPID PANEL
Chol/HDL Ratio: 4.4 ratio (ref 0.0–5.0)
Cholesterol, Total: 141 mg/dL (ref 100–199)
HDL: 32 mg/dL — ABNORMAL LOW (ref >39)
LDL Chol Calc (NIH): 94 mg/dL (ref 0–99)
Triglycerides: 76 mg/dL (ref 0–149)
VLDL Cholesterol Cal: 15 mg/dL (ref 5–40)

## 2023-01-08 LAB — HEMOGLOBIN A1C
Est. average glucose Bld gHb Est-mCnc: 105 mg/dL
Hgb A1c MFr Bld: 5.3 % (ref 4.8–5.6)

## 2023-01-08 LAB — VITAMIN B12: Vitamin B-12: 763 pg/mL (ref 232–1245)

## 2023-01-08 LAB — INSULIN, RANDOM: INSULIN: 31.1 u[IU]/mL — ABNORMAL HIGH (ref 2.6–24.9)

## 2023-01-08 LAB — VITAMIN D 25 HYDROXY (VIT D DEFICIENCY, FRACTURES): Vit D, 25-Hydroxy: 24 ng/mL — ABNORMAL LOW (ref 30.0–100.0)

## 2023-01-23 NOTE — Progress Notes (Unsigned)
Chief Complaint:   OBESITY Ronald George is here to discuss his progress with his obesity treatment plan along with follow-up of his obesity related diagnoses. Ronald George is on the Category 3 Plan with breakfast and lunch options and states he is following his eating plan approximately 75% of the time. Ronald George states he is walking 30 minutes 4 times per week.  Today's visit was #: 9 Starting weight: 306 lbs Starting date: 07/09/2022 Today's weight: 282 lbs Today's date: 01/07/2023 Total lbs lost to date: 24 Total lbs lost since last in-office visit: 6  Interim History: Ronald George had a fever of 103.5 and was sick the past several weeks wit hURI symptoms and coughing. He is not eating much at times. Pt has no issues or concerns with meal plan.  Subjective:   1. Insulin resistance Goal is HgbA1c < 5.7, fasting insulin closer to 5.    2. Vitamin D deficiency Ronald George Needs is tolerating medication(s) well without side effects.  Medication compliance is good as patient endorses taking it as prescribed.  Symptoms are stable and the patient denies additional concerns regarding this condition.  Medication: Ergocalciferol  3. Vitamin B12 deficiency Ronald George is taking OTC B12 500 mcg.  4. Low HDL (under 40) Ronald George's last HDL was low at 38 on 07/09/2022.  5. NAFLD (nonalcoholic fatty liver disease) Pt reports some RUQ "tinges" but not pain from time to time. He denies nausea or vomiting, and is not sure exactly when it occurs. Ronald George denies alcohol intake.  Assessment/Plan:   Orders Placed This Encounter  Procedures   Comprehensive metabolic panel   Vitamin Z61   VITAMIN D 25 Hydroxy (Vit-D Deficiency, Fractures)   Hemoglobin A1c   Insulin, random   Lipid panel    Medications Discontinued During This Encounter  Medication Reason   allopurinol (ZYLOPRIM) 100 MG tablet Completed Course   colchicine 0.6 MG tablet Completed Course   pantoprazole (PROTONIX) 40 MG tablet Completed Course      No orders of the defined types were placed in this encounter.    1. Insulin resistance Ronald George will continue to work on weight loss, exercise, and decreasing simple carbohydrates to help decrease the risk of diabetes. Ronald George agreed to follow-up with [place] as directed to closely monitor his progress.  Lab/Orders today: - Hemoglobin A1c - Insulin, random  2. Vitamin D deficiency Low Vitamin D level contributes to fatigue and are associated with obesity, breast, and colon cancer. He agrees to continue to take prescription Vitamin D @50 ,000 IU every week and will follow-up for routine testing of Vitamin D, at least 2-3 times per year to avoid over-replacement.  Lab/Orders today or future: - VITAMIN D 25 Hydroxy (Vit-D Deficiency, Fractures)  3. Vitamin B12 deficiency The diagnosis was reviewed with the patient. Counseling provided today, see below. We will continue to monitor. Orders and follow up as documented in patient record.  Counseling The body needs vitamin B12: to make red blood cells; to make DNA; and to help the nerves work properly so they can carry messages from the brain to the body.  The main causes of vitamin B12 deficiency include dietary deficiency, digestive diseases, pernicious anemia, and having a surgery in which part of the stomach or small intestine is removed.  Certain medicines can make it harder for the body to absorb vitamin B12. These medicines include: heartburn medications; some antibiotics; some medications used to treat diabetes, gout, and high cholesterol.  In some cases, there are no symptoms of this  condition. If the condition leads to anemia or nerve damage, various symptoms can occur, such as weakness or fatigue, shortness of breath, and numbness or tingling in your hands and feet.   Treatment:  May include taking vitamin B12 supplements.  Avoid alcohol.  Eat lots of healthy foods that contain vitamin B12: Beef, pork, chicken, [place], and organ meats,  such as liver.  Seafood: This includes clams, rainbow trout, salmon, tuna, and haddock. Eggs.  Cereal and dairy products that are fortified: This means that vitamin B12 has been added to the food.  Lab/Orders today or future: - Vitamin B12  4. Low HDL (under 40) Lab/Orders today or future: - Lipid panel  5. NAFLD (nonalcoholic fatty liver disease) Pt advised to monitor when symptoms occur (i.e. after fatty meal). Pt denies need for PCP OV and evaluation, but if symptoms worsen, he will.  Lab/Orders today or future: - Comprehensive metabolic panel  6. Obesity, current BMI 41.7 Ronald George is currently in the action stage of change. As such, his goal is to continue with weight loss efforts. He has agreed to the Category 3 Plan with breakfast and lunch options.   Pt is fasting and will check labs today. Labs last done 6+ months ago. Continue Saxenda. No need for refill at this time.  Exercise goals:  As is  Behavioral modification strategies: decreasing simple carbohydrates.  Ronald George has agreed to follow-up with our clinic in 3-4 weeks. He was informed of the importance of frequent follow-up visits to maximize his success with intensive lifestyle modifications for his multiple health conditions.   Ronald George was informed we would discuss his lab results at his next visit unless there is a critical issue that needs to be addressed sooner. Ronald George agreed to keep his next visit at the agreed upon time to discuss these results.  Objective:   Blood pressure (!) 137/91, pulse 77, temperature 98.7 F (37.1 C), height 5\' 9"  (1.753 m), weight 282 lb 9.6 oz (128.2 kg), SpO2 98 %. Body mass index is 41.73 kg/m.  General: Cooperative, alert, well developed, in no acute distress. HEENT: Conjunctivae and lids unremarkable. Cardiovascular: Regular rhythm.  Lungs: Normal work of breathing. Neurologic: No focal deficits.   Lab Results  Component Value Date   CREATININE 1.02 01/07/2023   BUN 10  01/07/2023   NA 140 01/07/2023   K 4.3 01/07/2023   CL 102 01/07/2023   CO2 23 01/07/2023   Lab Results  Component Value Date   ALT 24 01/07/2023   AST 23 01/07/2023   ALKPHOS 75 01/07/2023   BILITOT 0.8 01/07/2023   Lab Results  Component Value Date   HGBA1C 5.3 01/07/2023   HGBA1C 5.4 07/09/2022   HGBA1C 5.4 02/25/2022   Lab Results  Component Value Date   INSULIN 31.1 (H) 01/07/2023   INSULIN 27.2 (H) 07/09/2022   Lab Results  Component Value Date   TSH 2.090 07/09/2022   Lab Results  Component Value Date   CHOL 141 01/07/2023   HDL 32 (L) 01/07/2023   LDLCALC 94 01/07/2023   TRIG 76 01/07/2023   CHOLHDL 4.4 01/07/2023   Lab Results  Component Value Date   VD25OH 24.0 (L) 01/07/2023   VD25OH 22.6 (L) 07/09/2022   Lab Results  Component Value Date   WBC 6.3 07/09/2022   HGB 16.1 07/09/2022   HCT 47.6 07/09/2022   MCV 84 07/09/2022   PLT 233 07/09/2022   Attestation Statements:   Reviewed by clinician on Ronald George of visit:  allergies, medications, problem list, medical history, surgical history, family history, social history, and previous encounter notes.  I, Kathlene November, BS, CMA, am acting as transcriptionist for [employer], DO.  I have reviewed the above documentation for accuracy and completeness, and I agree with the above. [name], D.O.  The [place] was signed into law in 2016 which includes the topic of electronic health records.  This provides immediate access to information in MyChart.  This includes consultation notes, operative notes, office notes, lab results and pathology reports.  If you have any questions about what you read please let us know at your next visit so we can discuss your concerns and take corrective action if need be.  We are right here with you.

## 2023-01-28 DIAGNOSIS — H04123 Dry eye syndrome of bilateral lacrimal glands: Secondary | ICD-10-CM | POA: Diagnosis not present

## 2023-01-28 DIAGNOSIS — H0102B Squamous blepharitis left eye, upper and lower eyelids: Secondary | ICD-10-CM | POA: Diagnosis not present

## 2023-01-28 DIAGNOSIS — G932 Benign intracranial hypertension: Secondary | ICD-10-CM | POA: Diagnosis not present

## 2023-01-28 DIAGNOSIS — H0102A Squamous blepharitis right eye, upper and lower eyelids: Secondary | ICD-10-CM | POA: Diagnosis not present

## 2023-02-11 ENCOUNTER — Ambulatory Visit (INDEPENDENT_AMBULATORY_CARE_PROVIDER_SITE_OTHER): Payer: BC Managed Care – PPO | Admitting: Family Medicine

## 2023-02-11 ENCOUNTER — Encounter (INDEPENDENT_AMBULATORY_CARE_PROVIDER_SITE_OTHER): Payer: Self-pay | Admitting: Family Medicine

## 2023-02-11 VITALS — BP 143/95 | HR 62 | Temp 98.1°F | Ht 69.0 in | Wt 285.8 lb

## 2023-02-11 DIAGNOSIS — E88819 Insulin resistance, unspecified: Secondary | ICD-10-CM

## 2023-02-11 DIAGNOSIS — E559 Vitamin D deficiency, unspecified: Secondary | ICD-10-CM | POA: Diagnosis not present

## 2023-02-11 DIAGNOSIS — E538 Deficiency of other specified B group vitamins: Secondary | ICD-10-CM

## 2023-02-11 DIAGNOSIS — Z6841 Body Mass Index (BMI) 40.0 and over, adult: Secondary | ICD-10-CM

## 2023-02-11 DIAGNOSIS — K760 Fatty (change of) liver, not elsewhere classified: Secondary | ICD-10-CM | POA: Diagnosis not present

## 2023-02-11 MED ORDER — SAXENDA 18 MG/3ML ~~LOC~~ SOPN
1.2000 mg | PEN_INJECTOR | Freq: Every day | SUBCUTANEOUS | 0 refills | Status: DC
Start: 2023-02-11 — End: 2023-03-04

## 2023-02-11 MED ORDER — CYANOCOBALAMIN 500 MCG PO TABS: 500.0000 ug | ORAL_TABLET | Freq: Every day | ORAL | Status: AC

## 2023-02-11 MED ORDER — VITAMIN D (ERGOCALCIFEROL) 1.25 MG (50000 UNIT) PO CAPS: ORAL_CAPSULE | ORAL | 0 refills | Status: DC

## 2023-02-24 NOTE — Progress Notes (Unsigned)
Chief Complaint:   OBESITY Ronald George is here to discuss his progress with his obesity treatment plan along with follow-up of his obesity related diagnoses. Ronald George is on the Category 3 Plan with breakfast and lunch options and states he is following his eating plan approximately 75% of the time. Ronald George states he is 30 minutes 3-4 times per week.  Today's visit was #: 10 Starting weight: 306 lbs Starting date: 07/09/2022 Today's weight: 285 lbs Today's date: 02/11/2023 Total lbs lost to date: 21 lbs Total lbs lost since last in-office visit: +3 lbs  Interim History: Patient has a lot more stress in life and work lately.  Difficulties with getting Saxenda and thus has been taking it sporadically.  He eats less overall.  Here to review labs drawn at last office visit.  Subjective:   1. NAFLD (nonalcoholic fatty liver disease) Discussed labs with patient today. Reviewed CMP.  2. Insulin resistance Worsening.  Discussed labs with patient today. A1c is 5.3, fasting insulin slightly higher at 31.1.  3. Vitamin B12 deficiency Discussed labs with patient today. Vitamin B12 was 327 and has increased to 763.  4. Vitamin D deficiency Discussed labs with patient today. Patient has been taking ergocalciferol consistently once weekly.  Vitamin D level still not at goal.  Assessment/Plan:  No orders of the defined types were placed in this encounter.   Medications Discontinued During This Encounter  Medication Reason   acetaZOLAMIDE ER (DIAMOX) 500 MG capsule Completed Course   traZODone (DESYREL) 100 MG tablet Completed Course   cyanocobalamin (VITAMIN B12) 500 MCG tablet Reorder   Liraglutide -Weight Management (SAXENDA) 18 MG/3ML SOPN Reorder   Vitamin D, Ergocalciferol, (DRISDOL) 1.25 MG (50000 UNIT) CAPS capsule Reorder     Meds ordered this encounter  Medications   Vitamin D, Ergocalciferol, (DRISDOL) 1.25 MG (50000 UNIT) CAPS capsule    Sig: 1 po q Wed and Sun     Dispense:  8 capsule    Refill:  0   Liraglutide -Weight Management (SAXENDA) 18 MG/3ML SOPN    Sig: Inject 1.2 mg into the skin daily.    Dispense:  6 mL    Refill:  0    Patient will have discount coupon, please include novofine needles QS   cyanocobalamin (VITAMIN B12) 500 MCG tablet    Sig: Take 1 tablet (500 mcg total) by mouth daily.     1. NAFLD (nonalcoholic fatty liver disease) ALT, AST, normal and improved now.  2. Insulin resistance Handouts on insulin resistance given and counseling done.  Continue Saxenda, PNP on weight loss.  3. Vitamin B12 deficiency Continue 500 mcg B12 OTC daily.  Continue PNP with B12 rich foods.  Refill- cyanocobalamin (VITAMIN B12) 500 MCG tablet; Take 1 tablet (500 mcg total) by mouth daily.  4. Vitamin D deficiency Counseling done.  Increase to twice weekly.  Refill and increase- Vitamin D, Ergocalciferol, (DRISDOL) 1.25 MG (50000 UNIT) CAPS capsule; 1 po q Wed and Sun  Dispense: 8 capsule; Refill: 0  5. BMI 40.0-44.9, adult (HCC)-current bmi 42.2  6. Morbid obesity (HCC)-start bmi 45.19 Patient goal 10,000 steps per day.  Refill- Liraglutide -Weight Management (SAXENDA) 18 MG/3ML SOPN; Inject 1.2 mg into the skin daily.  Dispense: 6 mL; Refill: 0  Ronald George is currently in the action stage of change. As such, his goal is to continue with weight loss efforts. He has agreed to the Category 3 Plan with breakfast and lunch options.   Exercise goals: For  substantial health benefits, adults should do at least 150 minutes (2 hours and 30 minutes) a week of moderate-intensity, or 75 minutes (1 hour and 15 minutes) a week of vigorous-intensity aerobic physical activity, or an equivalent combination of moderate- and vigorous-intensity aerobic activity. Aerobic activity should be performed in episodes of at least 10 minutes, and preferably, it should be spread throughout the week.  Behavioral modification strategies: decreasing eating out and no  skipping meals.  Ronald George has agreed to follow-up with our clinic in 3-4 weeks. He was informed of the importance of frequent follow-up visits to maximize his success with intensive lifestyle modifications for his multiple health conditions.   Objective:   Blood pressure (!) 143/95, pulse 62, temperature 98.1 F (36.7 C), height '5\' 9"'$  (1.753 m), weight 285 lb 12.8 oz (129.6 kg), SpO2 98 %. Body mass index is 42.21 kg/m.  General: Cooperative, alert, well developed, in no acute distress. HEENT: Conjunctivae and lids unremarkable. Cardiovascular: Regular rhythm.  Lungs: Normal work of breathing. Neurologic: No focal deficits.   Lab Results  Component Value Date   CREATININE 1.02 01/07/2023   BUN 10 01/07/2023   NA 140 01/07/2023   K 4.3 01/07/2023   CL 102 01/07/2023   CO2 23 01/07/2023   Lab Results  Component Value Date   ALT 24 01/07/2023   AST 23 01/07/2023   ALKPHOS 75 01/07/2023   BILITOT 0.8 01/07/2023   Lab Results  Component Value Date   HGBA1C 5.3 01/07/2023   HGBA1C 5.4 07/09/2022   HGBA1C 5.4 02/25/2022   Lab Results  Component Value Date   INSULIN 31.1 (H) 01/07/2023   INSULIN 27.2 (H) 07/09/2022   Lab Results  Component Value Date   TSH 2.090 07/09/2022   Lab Results  Component Value Date   CHOL 141 01/07/2023   HDL 32 (L) 01/07/2023   LDLCALC 94 01/07/2023   TRIG 76 01/07/2023   CHOLHDL 4.4 01/07/2023   Lab Results  Component Value Date   VD25OH 24.0 (L) 01/07/2023   VD25OH 22.6 (L) 07/09/2022   Lab Results  Component Value Date   WBC 6.3 07/09/2022   HGB 16.1 07/09/2022   HCT 47.6 07/09/2022   MCV 84 07/09/2022   PLT 233 07/09/2022   No results found for: "IRON", "TIBC", "FERRITIN"  Attestation Statements:   Reviewed by clinician on day of visit: allergies, medications, problem list, medical history, surgical history, family history, social history, and previous encounter notes.  I, Davy Pique, RMA, am acting as  Location manager for [employer], DO.   I have reviewed the above documentation for accuracy and completeness, and I agree with the above. [name], D.O.  The [place] was signed into law in 2016 which includes the topic of electronic health records.  This provides immediate access to information in MyChart.  This includes consultation notes, operative notes, office notes, lab results and pathology reports.  If you have any questions about what you read please let us know at your next visit so we can discuss your concerns and take corrective action if need be.  We are right here with you.

## 2023-03-04 ENCOUNTER — Encounter (INDEPENDENT_AMBULATORY_CARE_PROVIDER_SITE_OTHER): Payer: Self-pay | Admitting: Family Medicine

## 2023-03-04 ENCOUNTER — Ambulatory Visit (INDEPENDENT_AMBULATORY_CARE_PROVIDER_SITE_OTHER): Payer: BC Managed Care – PPO | Admitting: Family Medicine

## 2023-03-04 ENCOUNTER — Telehealth (INDEPENDENT_AMBULATORY_CARE_PROVIDER_SITE_OTHER): Payer: Self-pay

## 2023-03-04 VITALS — BP 143/93 | HR 67 | Temp 98.0°F | Ht 69.0 in | Wt 288.4 lb

## 2023-03-04 DIAGNOSIS — E88819 Insulin resistance, unspecified: Secondary | ICD-10-CM | POA: Diagnosis not present

## 2023-03-04 DIAGNOSIS — E559 Vitamin D deficiency, unspecified: Secondary | ICD-10-CM

## 2023-03-04 DIAGNOSIS — K760 Fatty (change of) liver, not elsewhere classified: Secondary | ICD-10-CM

## 2023-03-04 DIAGNOSIS — G932 Benign intracranial hypertension: Secondary | ICD-10-CM | POA: Diagnosis not present

## 2023-03-04 DIAGNOSIS — F419 Anxiety disorder, unspecified: Secondary | ICD-10-CM

## 2023-03-04 DIAGNOSIS — E538 Deficiency of other specified B group vitamins: Secondary | ICD-10-CM

## 2023-03-04 DIAGNOSIS — Z6841 Body Mass Index (BMI) 40.0 and over, adult: Secondary | ICD-10-CM

## 2023-03-04 MED ORDER — SERTRALINE HCL 25 MG PO TABS: ORAL_TABLET | ORAL | 0 refills | Status: DC

## 2023-03-04 MED ORDER — VITAMIN D (ERGOCALCIFEROL) 1.25 MG (50000 UNIT) PO CAPS: ORAL_CAPSULE | ORAL | 0 refills | Status: DC

## 2023-03-04 MED ORDER — WEGOVY 0.5 MG/0.5ML ~~LOC~~ SOAJ: 0.5000 mg | SUBCUTANEOUS | 0 refills | Status: DC

## 2023-03-04 NOTE — Telephone Encounter (Signed)
Prior auth faxed to [facility] for [facility].

## 2023-03-04 NOTE — Progress Notes (Signed)
Ronald George, D.O.  ABFM, ABOM Specializing in Clinical Bariatric Medicine  Office located at: [address], [place]  [address]   WEIGHT SUMMARY AND BIOMETRICS  Weight Lost Since Last Visit: 3  Vitals Temp: 77 F (36.7 C) BP: (!) 143/93 Pulse Rate: 67 SpO2: 98 %   Anthropometric Measurements Height: '5\' 9"'$  (1.753 m) Weight: 288 lb 6.4 oz (130.8 kg) BMI (Calculated): 42.57 Weight at Last Visit: 285lb Weight Lost Since Last Visit: 3 Starting Weight: 306lb Total Weight Loss (lbs): 21 lb (9.526 kg) Peak Weight: 309lb   Body Composition  Body Fat %: 38.9 % Fat Mass (lbs): 112.2 lbs Muscle Mass (lbs): 167.6 lbs Total Body Water (lbs): 127 lbs Visceral Fat Rating : 23   Other Clinical Data Fasting: yes Labs: no Today's Visit #: 11 Starting Date: 07/09/22     Chief Complaint:   OBESITY  Ronald George is here to discuss his progress with his obesity treatment plan. He is on the the Category 3 Plan with breakfast and lunch options and states he is following his eating plan approximately 75 % of the time. He states he is not exercising.   Interval History:  Since last office visit he has been doing well following his plan- packing lunches and not skipping any meals. They very rarely go out to eat. He states that he was inconsistently using the [place] prior to his last visit but after going back up to the 1.'2mg'$  daily on a more regular basis he had severe nausea.   He states that while off of the [place] he was craving sodas and snacked more. He often likes to have processed carbs with meals such as breads and pastas. He drinks 1 glass of sweet tea in the mornings. He was drinking mio sugar free sweet tea but did not like this as much.  He gets full quickly even when off of the Saxenda. He has been much better about portion control and not overeating.  He states that he has not been walking more than he was at his last visit. He struggles to walk during  the work hours, but would like to be more mindful of walking after his shift.  He notes significant stress and frustration at work. He feels very anxious often on Sundays prior to starting the work week. He denies any emotional eating habits. He denies any depression. He tried Zoloft many years ago which caused him to feel drained with brain fog and muted personality- he is unsure of what dose he was on at that time. He tried Trazodone in the past prn. Dr. Jaynee Eagles has advised him against use of Xanax due to the risks for dementia given his family history.  He does not drink alcohol.  He does not check his BP at home.   Review of Systems: Pertinent positives were addressed with patient today.   Subjective:   PHYSICAL EXAM:  Blood pressure (!) 143/93, pulse 67, temperature 98 F (36.7 C), height '5\' 9"'$  (1.753 m), weight 288 lb 6.4 oz (130.8 kg), SpO2 98 %. Body mass index is 42.59 kg/m.  General: Well Developed, well nourished, and in no acute distress.  HEENT: Normocephalic, atraumatic Skin: Warm and dry, cap RF less 2 sec, good turgor Chest:  Normal excursion, shape, no gross abn Respiratory: speaking in full sentences, no conversational dyspnea NeuroM-Sk: Ambulates w/o assistance, moves * 4 Psych: A and O *3, insight good, mood-full   DIAGNOSTIC DATA REVIEWED:  BMET  Component Value Date/Time   NA 140 01/07/2023 1049   K 4.3 01/07/2023 1049   CL 102 01/07/2023 1049   CO2 23 01/07/2023 1049   GLUCOSE 86 01/07/2023 1049   GLUCOSE 111 (H) 02/17/2018 0926   BUN 10 01/07/2023 1049   CREATININE 1.02 01/07/2023 1049   CALCIUM 9.3 01/07/2023 1049   GFRNONAA 82 03/02/2020 0857   GFRAA 95 03/02/2020 0857   Lab Results  Component Value Date   HGBA1C 5.3 01/07/2023   HGBA1C 5.4 02/25/2022   Lab Results  Component Value Date   INSULIN 31.1 (H) 01/07/2023   INSULIN 27.2 (H) 07/09/2022   Lab Results  Component Value Date   TSH 2.090 07/09/2022   CBC    Component Value  Date/Time   WBC 6.3 07/09/2022 1019   WBC 6.9 02/17/2018 0926   RBC 5.70 07/09/2022 1019   RBC 5.29 02/17/2018 0926   HGB 16.1 07/09/2022 1019   HCT 47.6 07/09/2022 1019   PLT 233 07/09/2022 1019   MCV 84 07/09/2022 1019   MCH 28.2 07/09/2022 1019   MCH 29.1 02/17/2018 0926   MCHC 33.8 07/09/2022 1019   MCHC 35.4 02/17/2018 0926   RDW 12.9 07/09/2022 1019   Iron Studies No results found for: "IRON", "TIBC", "FERRITIN", "IRONPCTSAT" Lipid Panel     Component Value Date/Time   CHOL 141 01/07/2023 1049   TRIG 76 01/07/2023 1049   HDL 32 (L) 01/07/2023 1049   CHOLHDL 4.4 01/07/2023 1049   LDLCALC 94 01/07/2023 1049   Hepatic Function Panel     Component Value Date/Time   PROT 7.3 01/07/2023 1049   ALBUMIN 4.1 01/07/2023 1049   AST 23 01/07/2023 1049   ALT 24 01/07/2023 1049   ALKPHOS 75 01/07/2023 1049   BILITOT 0.8 01/07/2023 1049      Component Value Date/Time   TSH 2.090 07/09/2022 1019   Nutritional Lab Results  Component Value Date   VD25OH 24.0 (L) 01/07/2023   VD25OH 22.6 (L) 07/09/2022     Assessment and Plan:   No orders of the defined types were placed in this encounter.   Medications Discontinued During This Encounter  Medication Reason   Liraglutide -Weight Management (SAXENDA) 18 MG/3ML SOPN    Vitamin D, Ergocalciferol, (DRISDOL) 1.25 MG (50000 UNIT) CAPS capsule Reorder     Meds ordered this encounter  Medications   Vitamin D, Ergocalciferol, (DRISDOL) 1.25 MG (50000 UNIT) CAPS capsule    Sig: 1 po q Wed and Sun    Dispense:  8 capsule    Refill:  0   Semaglutide-Weight Management (WEGOVY) 0.5 MG/0.5ML SOAJ    Sig: Inject 0.5 mg into the skin once a week.    Dispense:  2 mL    Refill:  0   sertraline (ZOLOFT) 25 MG tablet    Sig: 1/2 tab po qd    Dispense:  30 tablet    Refill:  0      TREATMENT PLAN FOR OBESITY:  Recommended Dietary Goals  Lazar is currently in the action stage of change. As such, his goal is to continue  weight management plan. He has agreed to the Category 3 Plan with breakfast and lunch options.  Behavioral Intervention  We discussed the following Behavioral Modification Strategies today: increasing lean protein intake, decreasing simple carbohydrates , increasing vegetables, work on meal planning and easy cooking plans, and work on tracking and journaling calories using tracking App.   Additional resources provided today: hidden calorie handout  provided  Recommended Physical Activity Goals  Vallen has been advised to work up to 150 minutes of moderate intensity aerobic activity a week and strengthening exercises 2-3 times per week for cardiovascular health, weight loss maintenance and preservation of muscle mass.   He has agreed to increase physical activity in their day and reduce sedentary time (increase NEAT).    Pharmacotherapy We discussed various medication options to help Stace with his weight loss efforts and we both agreed to d/c Saxenda and start [facility] 0.'25mg'$  weekly.   ASSOCIATED CONDITIONS ADDRESSED TODAY    Morbid obesity (HCC)-start bmi 45.19 BMI 40.0-44.9, adult (HCC)-current bmi 42.2 Assessment: Condition is Stable. Biometric data collected today, was reviewed with patient.  Muscle mass has increased by .8lb. Fat mass has has increased by 1.8lbs.  Plan: Encouraged to gradually reduce calorically high sweet tea and transition to unsweet tea completely. Continue category 3 meal plan with breakfast and lunch options. Advised to work on measuring snack calories and proteins. Will discontinue Saxenda due to supply shortages. Will change to [facility] 0.'25mg'$  weekly.   IIH (idiopathic intracranial hypertension) Assessment: Condition is Stable. BP today is 143/93. He reports compliance with Toprol XL '25mg'$  daily he only took today's dose 1 hour ago. He does not check his BP at home.  Plan: Continue medication regimen at the recommendation of neurology Dr. Sarina Ill. Advised him to occasionally check his BP at home.   Insulin resistance Assessment: Condition is Stable. Labs were reviewed. Last A1c was 5.3 and last insulin was 31.1 on 01/07/23.  Plan:Continue category 3 meal plan with breakfast and lunch options.  Will discontinue Saxenda due to supply shortages. Will change to [facility] 0.'25mg'$  weekly.   NAFLD (nonalcoholic fatty liver disease) Assessment: Condition is Stable. Labs were reviewed. Last liver panel was WNL: ALT of 24, AST of 23, ALP was 75, and total bilirubin was 0.8 on 01/07/23. He does not drink alcohol.  Plan:Will continue to monitor.   Vitamin D deficiency Assessment: Condition is Stable. Labs were reviewed. Last vitamin D was 24.0 on 01/07/23.  Plan: Continue Ergocalciferol 50K IU twice weekly- provided with refill today.   B12 deficiency due to diet Assessment: Condition is Stable. Labs were reviewed. Last vitamin B12 was 763 on 01/07/23.  Plan: Continue taking B12 554mg daily.   Anxiety Assessment: Condition is Worsening. He has significant anxiety with his high stress work. This causes him to have unprovoked episodes of anxiety and nailbiting.  Plan: Will start Zoloft 12.'5mg'$  daily. He will inform me of any negative side effects if these occur. If he tolerates this well, he may increase to '25mg'$  daily after a week or so if he feels the lower dose I dose is ineffective.    Return in about 4 weeks (around 04/01/2023)..Marland KitchenHe was informed of the importance of frequent follow up visits to maximize his success with intensive lifestyle modifications for his multiple health conditions.   Attestations:   Reviewed by clinician on day of visit: allergies, medications, problem list, medical history, surgical history, family history, social history, and previous encounter notes.  Patient was in the office today and time spent on visit including pre-visit chart review and post-visit care/coordination of care and electronic medical  record documentation was 60 minutes. 50% of the time was in face to face counseling of this patient's medical condition(s) and providing education on treatment options to include the first-line treatment of diet and lifestyle modification.  I,Alexis Herring,acting as a sEducation administratorfor [employer] DO.,have  documented all relevant documentation on the behalf of Mellody Dance, DO,as directed by  Mellody Dance, DO while in the presence of Mellody Dance, DO.  I, Mellody Dance, DO, have reviewed all documentation for this visit. The documentation on 03/04/23 for the exam, diagnosis, procedures, and orders are all accurate and complete.

## 2023-03-05 NOTE — Telephone Encounter (Signed)
Fax received from [facility] for additional info forms to be filled out.  Forms filled out and signed by Dr. Raliegh Scarlet and faxed to [phone]  Phone #: [phone] Opt 3, opt 4, opt 2

## 2023-03-05 NOTE — Telephone Encounter (Signed)
Case #: [id number]

## 2023-03-09 NOTE — Telephone Encounter (Signed)
Approved-Ref #[id number] Exp: 03/07/23-07/08/23

## 2023-04-01 ENCOUNTER — Ambulatory Visit (INDEPENDENT_AMBULATORY_CARE_PROVIDER_SITE_OTHER): Payer: BC Managed Care – PPO | Admitting: Family Medicine

## 2023-04-01 ENCOUNTER — Encounter (INDEPENDENT_AMBULATORY_CARE_PROVIDER_SITE_OTHER): Payer: Self-pay | Admitting: Family Medicine

## 2023-04-01 VITALS — BP 138/89 | HR 66 | Temp 98.3°F | Ht 69.0 in | Wt 283.2 lb

## 2023-04-01 DIAGNOSIS — F419 Anxiety disorder, unspecified: Secondary | ICD-10-CM

## 2023-04-01 DIAGNOSIS — G932 Benign intracranial hypertension: Secondary | ICD-10-CM

## 2023-04-01 DIAGNOSIS — E559 Vitamin D deficiency, unspecified: Secondary | ICD-10-CM

## 2023-04-01 DIAGNOSIS — Z6841 Body Mass Index (BMI) 40.0 and over, adult: Secondary | ICD-10-CM

## 2023-04-01 MED ORDER — SEMAGLUTIDE-WEIGHT MANAGEMENT 1 MG/0.5ML ~~LOC~~ SOAJ: 1.0000 mg | SUBCUTANEOUS | 0 refills | Status: AC

## 2023-04-01 MED ORDER — VITAMIN D (ERGOCALCIFEROL) 1.25 MG (50000 UNIT) PO CAPS: ORAL_CAPSULE | ORAL | 0 refills | Status: DC

## 2023-04-01 NOTE — Progress Notes (Signed)
Carlye Grippe, D.O.  ABFM, ABOM Specializing in Clinical Bariatric Medicine  Office located at: [address] [place], [place]  [address]     Assessment and Plan:   No orders of the defined types were placed in this encounter.   Medications Discontinued During This Encounter  Medication Reason   Semaglutide-Weight Management (WEGOVY) 0.5 MG/0.5ML SOAJ    Vitamin D, Ergocalciferol, (DRISDOL) 1.25 MG (50000 UNIT) CAPS capsule Reorder   sertraline (ZOLOFT) 25 MG tablet Discontinued by provider     Meds ordered this encounter  Medications   Vitamin D, Ergocalciferol, (DRISDOL) 1.25 MG (50000 UNIT) CAPS capsule    Sig: 1 po q Wed and Sun    Dispense:  8 capsule    Refill:  0   Semaglutide-Weight Management 1 MG/0.5ML SOAJ    Sig: Inject 1 mg into the skin once a week.    Dispense:  2 mL    Refill:  0     IIH (idiopathic intracranial hypertension) Assessment: Condition is Improving, but not optimized. Last 3 blood pressure readings in our office are as follows: BP Readings from Last 3 Encounters:  04/01/23 138/89  03/04/23 (!) 143/93  02/11/23 (!) 143/95  He has been more compliant with meds daily. No concerns. Asymptomatic.   Plan:  Continue with med as recommended by specialist.  - I encouraged patient to check blood pressure at home 1-2 times a week.  - Continue with Prudent nutritional plan and low sodium diet, advance exercise as tolerated.     Vitamin D deficiency Assessment: Condition is Improving, but not optimized. Lab Results  Component Value Date   VD25OH 24.0 (L) 01/07/2023   VD25OH 22.6 (L) 07/09/2022  Recently, patient is more compliant and tolerant with Ergocalciferol 50K IU twice weekly   Plan: Will refill Vitamin D today.  Continue with Ergocalciferol 50K IU twice weekly  - weight loss will likely improve availability of vitamin D, thus encouraged Jaiveon to continue with meal plan and their weight loss efforts to further improve this  condition.  Thus, we will need to monitor levels regularly (every 3-4 mo on average) to keep levels within normal limits and prevent over supplementation.   Anxiety Assessment: Condition is not optimized His work-life is still very stressful. He started the Zoloft 25 mg qd, however stopped taking it because he developed severe sweating on his feet and an unpleasant odorous breath.  Plan: Discontinue Zoloft I recommended the importance of seeing a life coach, meditation, walking, and prayer to help manage the anxiety.    TREATMENT PLAN FOR OBESITY: BMI 40.0-44.9, adult (HCC)-current bmi 42.6 Morbid obesity (HCC)-start bmi 45.19/date 07/09/22 Assessment: Condition is Not optimized. Biometric data collected today, was reviewed with patient.  Fat mass has decreased by 11lb. Muscle mass has increased by 5.6lb. Total body water has decreased by 3lb. Patient began [facility] last Saturday and endorses feeling more full on the Wegovy 0.5 mg weekly. Denies any side effects. Patient noticed a small welt after injecting the [facility], which is likely a normal reaction.   Plan:  Increase Wegovy to 1 mg weekly.  - Continue with  Category 3 meal plan with breakfast and lunch options.  I emphasized to him the importance of eating all the food on the plan, despite being on Wegovy and feeling more full.  I did counseling on how to inject the [facility] properly.   Behavioral Intervention Additional resources provided today: patient declined Evidence-based interventions for health behavior change were  utilized today including the discussion of self monitoring techniques, problem-solving barriers and SMART goal setting techniques.   Regarding patient's less desirable eating habits and patterns, we employed the technique of small changes.  Pt will specifically work on: continue with Category 3 meal plan with breakfast and lunch options for next visit.    Recommended Physical Activity Goals He has agreed to Continue  current level of physical activity   FOLLOW UP: Return in about 4 weeks (around 04/29/2023). He was informed of the importance of frequent follow up visits to maximize his success with intensive lifestyle modifications for his multiple health conditions.  Subjective:   Chief complaint: Obesity Tarig is here to discuss his progress with his obesity treatment plan. He is on the the Category 3 Plan with breakfast and lunch options and states he is following his eating plan approximately 75% of the time. He states he is not exercising.   Interval History:  Vennie Giamanco is here for a follow up office visit. We reviewed his meal plan and all questions were answered. Patient's food recall appears to be accurate and consistent with what is on plan when he is following it. When eating on plan, his hunger and cravings are well controlled.     Pharmacotherapy for weight loss: He is currently taking Wegovy for medical weight loss.  Denies side effects.    Review of Systems:  Pertinent positives were addressed with patient today.  Weight Summary and Biometrics   Weight Lost Since Last Visit: 5lb  No data recorded   Vitals Temp: 98.3 F (36.8 C) BP: 138/89 Pulse Rate: 66 SpO2: 99 %   Anthropometric Measurements Height: 5\' 9"  (1.753 m) Weight: 283 lb 3.2 oz (128.5 kg) BMI (Calculated): 41.8 Weight at Last Visit: 288lb Weight Lost Since Last Visit: 5lb Starting Weight: 306lb Total Weight Loss (lbs): 26 lb (11.8 kg) Peak Weight: 309lb   Body Composition  Body Fat %: 35.7 % Fat Mass (lbs): 101.2 lbs Muscle Mass (lbs): 173.2 lbs Total Body Water (lbs): 124 lbs Visceral Fat Rating : 21   Other Clinical Data Fasting: no Labs: no Today's Visit #: 12 Starting Date: 07/09/22    Objective:   PHYSICAL EXAM:  Blood pressure 138/89, pulse 66, temperature 98.3 F (36.8 C), height 5\' 9"  (1.753 m), weight 283 lb 3.2 oz (128.5 kg), SpO2 99 %. Body mass index is 41.82  kg/m.  General: Well Developed, well nourished, and in no acute distress.  HEENT: Normocephalic, atraumatic Skin: Warm and dry, cap RF less 2 sec, good turgor Chest:  Normal excursion, shape, no gross abn Respiratory: speaking in full sentences, no conversational dyspnea NeuroM-Sk: Ambulates w/o assistance, moves * 4 Psych: A and O *3, insight good, mood-full  DIAGNOSTIC DATA REVIEWED:  BMET    Component Value Date/Time   NA 140 01/07/2023 1049   K 4.3 01/07/2023 1049   CL 102 01/07/2023 1049   CO2 23 01/07/2023 1049   GLUCOSE 86 01/07/2023 1049   GLUCOSE 111 (H) 02/17/2018 0926   BUN 10 01/07/2023 1049   CREATININE 1.02 01/07/2023 1049   CALCIUM 9.3 01/07/2023 1049   GFRNONAA 82 03/02/2020 0857   GFRAA 95 03/02/2020 0857   Lab Results  Component Value Date   HGBA1C 5.3 01/07/2023   HGBA1C 5.4 02/25/2022   Lab Results  Component Value Date   INSULIN 31.1 (H) 01/07/2023   INSULIN 27.2 (H) 07/09/2022   Lab Results  Component Value Date   TSH 2.090 07/09/2022  CBC    Component Value Date/Time   WBC 6.3 07/09/2022 1019   WBC 6.9 02/17/2018 0926   RBC 5.70 07/09/2022 1019   RBC 5.29 02/17/2018 0926   HGB 16.1 07/09/2022 1019   HCT 47.6 07/09/2022 1019   PLT 233 07/09/2022 1019   MCV 84 07/09/2022 1019   MCH 28.2 07/09/2022 1019   MCH 29.1 02/17/2018 0926   MCHC 33.8 07/09/2022 1019   MCHC 35.4 02/17/2018 0926   RDW 12.9 07/09/2022 1019   Iron Studies No results found for: "IRON", "TIBC", "FERRITIN", "IRONPCTSAT" Lipid Panel     Component Value Date/Time   CHOL 141 01/07/2023 1049   TRIG 76 01/07/2023 1049   HDL 32 (L) 01/07/2023 1049   CHOLHDL 4.4 01/07/2023 1049   LDLCALC 94 01/07/2023 1049   Hepatic Function Panel     Component Value Date/Time   PROT 7.3 01/07/2023 1049   ALBUMIN 4.1 01/07/2023 1049   AST 23 01/07/2023 1049   ALT 24 01/07/2023 1049   ALKPHOS 75 01/07/2023 1049   BILITOT 0.8 01/07/2023 1049      Component Value Date/Time    TSH 2.090 07/09/2022 1019   Nutritional Lab Results  Component Value Date   VD25OH 24.0 (L) 01/07/2023   VD25OH 22.6 (L) 07/09/2022    Attestations:   Reviewed by clinician on day of visit: allergies, medications, problem list, medical history, surgical history, family history, social history, and previous encounter notes.   I,Special Puri,acting as a Neurosurgeon for [employer], DO.,have documented all relevant documentation on the behalf of Thomasene Lot, DO,as directed by  Thomasene Lot, DO while in the presence of Thomasene Lot, DO.   I, Thomasene Lot, DO, have reviewed all documentation for this visit. The documentation on 04/01/23 for the exam, diagnosis, procedures, and orders are all accurate and complete.

## 2023-04-07 ENCOUNTER — Telehealth (INDEPENDENT_AMBULATORY_CARE_PROVIDER_SITE_OTHER): Payer: Self-pay

## 2023-04-07 NOTE — Telephone Encounter (Signed)
Prior auth submitted thru covermymeds. (Key: [id number]) Saxenda 18MG [name] pen-injectors

## 2023-04-09 DIAGNOSIS — M7752 Other enthesopathy of left foot: Secondary | ICD-10-CM | POA: Diagnosis not present

## 2023-04-09 DIAGNOSIS — M25571 Pain in right ankle and joints of right foot: Secondary | ICD-10-CM | POA: Diagnosis not present

## 2023-04-09 DIAGNOSIS — M10079 Idiopathic gout, unspecified ankle and foot: Secondary | ICD-10-CM | POA: Diagnosis not present

## 2023-04-09 DIAGNOSIS — M2022 Hallux rigidus, left foot: Secondary | ICD-10-CM | POA: Diagnosis not present

## 2023-04-13 ENCOUNTER — Telehealth (INDEPENDENT_AMBULATORY_CARE_PROVIDER_SITE_OTHER): Payer: Self-pay

## 2023-04-13 NOTE — Telephone Encounter (Signed)
Ronald George ([name]) [phone] Saxenda 18MG [name] pen-injectors Status: PA Response - ApprovedCreated: April 9th, 2024Sent: April 10th, 2024  B.Claudy Abdallah

## 2023-04-14 NOTE — Telephone Encounter (Signed)
REF #: [id number] EXP: 04/08/23-08/12/23

## 2023-04-22 DIAGNOSIS — Z1330 Encounter for screening examination for mental health and behavioral disorders, unspecified: Secondary | ICD-10-CM | POA: Diagnosis not present

## 2023-04-22 DIAGNOSIS — E790 Hyperuricemia without signs of inflammatory arthritis and tophaceous disease: Secondary | ICD-10-CM | POA: Diagnosis not present

## 2023-04-22 DIAGNOSIS — I491 Atrial premature depolarization: Secondary | ICD-10-CM | POA: Diagnosis not present

## 2023-04-22 DIAGNOSIS — I10 Essential (primary) hypertension: Secondary | ICD-10-CM | POA: Diagnosis not present

## 2023-04-30 ENCOUNTER — Encounter (INDEPENDENT_AMBULATORY_CARE_PROVIDER_SITE_OTHER): Payer: Self-pay | Admitting: Family Medicine

## 2023-05-06 ENCOUNTER — Encounter (INDEPENDENT_AMBULATORY_CARE_PROVIDER_SITE_OTHER): Payer: Self-pay | Admitting: Adult Health

## 2023-05-06 ENCOUNTER — Ambulatory Visit (INDEPENDENT_AMBULATORY_CARE_PROVIDER_SITE_OTHER): Payer: BC Managed Care – PPO | Admitting: Adult Health

## 2023-05-06 VITALS — BP 136/85 | HR 69 | Temp 98.2°F | Ht 69.0 in | Wt 278.0 lb

## 2023-05-06 DIAGNOSIS — E88819 Insulin resistance, unspecified: Secondary | ICD-10-CM | POA: Diagnosis not present

## 2023-05-06 DIAGNOSIS — Z6841 Body Mass Index (BMI) 40.0 and over, adult: Secondary | ICD-10-CM

## 2023-05-06 DIAGNOSIS — K219 Gastro-esophageal reflux disease without esophagitis: Secondary | ICD-10-CM

## 2023-05-06 DIAGNOSIS — E559 Vitamin D deficiency, unspecified: Secondary | ICD-10-CM

## 2023-05-06 DIAGNOSIS — E669 Obesity, unspecified: Secondary | ICD-10-CM | POA: Diagnosis not present

## 2023-05-06 DIAGNOSIS — F419 Anxiety disorder, unspecified: Secondary | ICD-10-CM

## 2023-05-06 MED ORDER — WEGOVY 0.5 MG/0.5ML ~~LOC~~ SOAJ: 0.5000 mg | SUBCUTANEOUS | 0 refills | Status: DC

## 2023-05-06 MED ORDER — VITAMIN D (ERGOCALCIFEROL) 1.25 MG (50000 UNIT) PO CAPS: ORAL_CAPSULE | ORAL | 0 refills | Status: DC

## 2023-05-06 NOTE — Progress Notes (Unsigned)
WEIGHT SUMMARY AND BIOMETRICS  Vitals Temp: 98.2 F (36.8 C) BP: 136/85 Pulse Rate: 69 SpO2: 100 %   Anthropometric Measurements Height: 5\' 9"  (1.753 m) Weight: 278 lb (126.1 kg) BMI (Calculated): 41.03 Weight at Last Visit: 283lb Weight Lost Since Last Visit: 5lb Weight Gained Since Last Visit: 0 Starting Weight: 306lb Total Weight Loss (lbs): 31 lb (14.1 kg)   Body Composition  Body Fat %: 31.4 % Fat Mass (lbs): 87.4 lbs Muscle Mass (lbs): 181.6 lbs Total Body Water (lbs): 124 lbs Visceral Fat Rating : 18   Other Clinical Data Fasting: no Labs: no Today's Visit #: 13 Starting Date: 07/09/22    Chief Complaint:   OBESITY Ronald George is here to discuss his progress with his obesity treatment plan. He is on the the Category 3 Plan and states he is following his eating plan approximately 75 % of the time.  He states he is not currently exercising.   Interim History: ***  Subjective:   1. Gastroesophageal reflux disease, unspecified whether esophagitis present ***  2. Insulin resistance ***  3. Vitamin D deficiency ***  4. Anxiety ***  5. BMI 40.0-44.9, adult (HCC)-current bmi 41.8 ***  6. Morbid obesity (HCC)-start bmi 45.19/date 07/09/22 ***   Assessment/Plan:   1. Gastroesophageal reflux disease, unspecified whether esophagitis present ***  2. Insulin resistance ***  3. Vitamin D deficiency *** - Vitamin D, Ergocalciferol, (DRISDOL) 1.25 MG (50000 UNIT) CAPS capsule; 1 po q Wed and Sun  Dispense: 8 capsule; Refill: 0  4. Anxiety ***  5. BMI 40.0-44.9, adult (HCC)-current bmi 41.8  Refill and DECREASE Wegovy 0.5mg  once week Disp 2ml RF0  Ronald George {CHL AMB IS/IS NOT:210130109} currently in the action stage of change. As such, his goal is to {MWMwtloss#1:210800005}. He has agreed to {MWMwtlossportion/plan2:23431}.   Exercise goals: {MWM EXERCISE RECS:23473}  Behavioral modification strategies:  {MWMwtlossdietstrategies3:23432}.  Ronald George has agreed to follow-up with our clinic in {NUMBER 1-10:22536} weeks. He was informed of the importance of frequent follow-up visits to maximize his success with intensive lifestyle modifications for his multiple health conditions.   ***delete paragraph if no labs orderedBrandon was informed we would discuss his lab results at his next visit unless there is a critical issue that needs to be addressed sooner. Ronald George agreed to keep his next visit at the agreed upon time to discuss these results.  Objective:   Blood pressure 136/85, pulse 69, temperature 98.2 F (36.8 C), height 5\' 9"  (1.753 m), weight 278 lb (126.1 kg), SpO2 100 %. Body mass index is 41.05 kg/m.  General: Cooperative, alert, well developed, in no acute distress. HEENT: Conjunctivae and lids unremarkable. Cardiovascular: Regular rhythm.  Lungs: Normal work of breathing. Neurologic: No focal deficits.   Lab Results  Component Value Date   CREATININE 1.02 01/07/2023   BUN 10 01/07/2023   NA 140 01/07/2023   K 4.3 01/07/2023   CL 102 01/07/2023   CO2 23 01/07/2023   Lab Results  Component Value Date   ALT 24 01/07/2023   AST 23 01/07/2023   ALKPHOS 75 01/07/2023   BILITOT 0.8 01/07/2023   Lab Results  Component Value Date   HGBA1C 5.3 01/07/2023   HGBA1C 5.4 07/09/2022   HGBA1C 5.4 02/25/2022   Lab Results  Component Value Date   INSULIN 31.1 (H) 01/07/2023   INSULIN 27.2 (H) 07/09/2022   Lab Results  Component Value Date   TSH 2.090 07/09/2022   Lab Results  Component Value Date  CHOL 141 01/07/2023   HDL 32 (L) 01/07/2023   LDLCALC 94 01/07/2023   TRIG 76 01/07/2023   CHOLHDL 4.4 01/07/2023   Lab Results  Component Value Date   VD25OH 24.0 (L) 01/07/2023   VD25OH 22.6 (L) 07/09/2022   Lab Results  Component Value Date   WBC 6.3 07/09/2022   HGB 16.1 07/09/2022   HCT 47.6 07/09/2022   MCV 84 07/09/2022   PLT 233 07/09/2022   No results  found for: "IRON", "TIBC", "FERRITIN"   Attestation Statements:   Reviewed by clinician on day of visit: allergies, medications, problem list, medical history, surgical history, family history, social history, and previous encounter notes.  I have reviewed the above documentation for accuracy and completeness, and I agree with the above. -  Kadeidra Coryell d. Arsalan Brisbin, NP-C

## 2023-05-07 ENCOUNTER — Telehealth (INDEPENDENT_AMBULATORY_CARE_PROVIDER_SITE_OTHER): Payer: Self-pay | Admitting: Adult Health

## 2023-05-07 NOTE — Telephone Encounter (Signed)
PA submitted for [facility], waiting on determination. 05/07/23

## 2023-05-11 NOTE — Telephone Encounter (Signed)
PA for [facility] got a letter back stating the medication was previously reviewed and approved. 05/11/2023

## 2023-06-01 ENCOUNTER — Telehealth (INDEPENDENT_AMBULATORY_CARE_PROVIDER_SITE_OTHER): Payer: Self-pay | Admitting: Adult Health

## 2023-06-01 ENCOUNTER — Telehealth: Payer: Self-pay

## 2023-06-01 NOTE — Telephone Encounter (Signed)
Patient and pharmacy is waiting on a PA for [facility] 0.5mg . Please call patient once complete.

## 2023-06-01 NOTE — Telephone Encounter (Signed)
PA submitted through CoverMyMeds.

## 2023-06-01 NOTE — Telephone Encounter (Signed)
PA submitted for [facility] through Cover My Meds. Awaiting insurance determination. Key: [id number]

## 2023-06-02 NOTE — Telephone Encounter (Signed)
Per Cover My Meds: Authorization On File.Authorization On File.Please note that authorization is already on file for the requested medication and is effective through (07/08/2023)

## 2023-06-03 ENCOUNTER — Ambulatory Visit (INDEPENDENT_AMBULATORY_CARE_PROVIDER_SITE_OTHER): Payer: BC Managed Care – PPO | Admitting: Family Medicine

## 2023-06-03 ENCOUNTER — Encounter (INDEPENDENT_AMBULATORY_CARE_PROVIDER_SITE_OTHER): Payer: Self-pay | Admitting: Family Medicine

## 2023-06-03 VITALS — BP 137/91 | HR 65 | Temp 98.1°F | Ht 69.0 in | Wt 281.0 lb

## 2023-06-03 DIAGNOSIS — G932 Benign intracranial hypertension: Secondary | ICD-10-CM | POA: Diagnosis not present

## 2023-06-03 DIAGNOSIS — E559 Vitamin D deficiency, unspecified: Secondary | ICD-10-CM

## 2023-06-03 DIAGNOSIS — H0102B Squamous blepharitis left eye, upper and lower eyelids: Secondary | ICD-10-CM | POA: Diagnosis not present

## 2023-06-03 DIAGNOSIS — H0102A Squamous blepharitis right eye, upper and lower eyelids: Secondary | ICD-10-CM | POA: Diagnosis not present

## 2023-06-03 DIAGNOSIS — E88819 Insulin resistance, unspecified: Secondary | ICD-10-CM

## 2023-06-03 DIAGNOSIS — H04123 Dry eye syndrome of bilateral lacrimal glands: Secondary | ICD-10-CM | POA: Diagnosis not present

## 2023-06-03 DIAGNOSIS — Z6841 Body Mass Index (BMI) 40.0 and over, adult: Secondary | ICD-10-CM | POA: Diagnosis not present

## 2023-06-03 MED ORDER — VITAMIN D (ERGOCALCIFEROL) 1.25 MG (50000 UNIT) PO CAPS: ORAL_CAPSULE | ORAL | 0 refills | Status: DC

## 2023-06-03 MED ORDER — WEGOVY 0.5 MG/0.5ML ~~LOC~~ SOAJ
0.5000 mg | SUBCUTANEOUS | 0 refills | Status: DC
Start: 2023-06-03 — End: 2023-06-15

## 2023-06-03 NOTE — Progress Notes (Signed)
Ronald George, D.O.  ABFM, ABOM Specializing in Clinical Bariatric Medicine  Office located at: [address] [place], [place]  [address]     Assessment and Plan:   Medications Discontinued During This Encounter  Medication Reason   Vitamin D, Ergocalciferol, (DRISDOL) 1.25 MG (50000 UNIT) CAPS capsule Reorder   Semaglutide-Weight Management (WEGOVY) 0.5 MG/0.5ML SOAJ Reorder     Meds ordered this encounter  Medications   Vitamin D, Ergocalciferol, (DRISDOL) 1.25 MG (50000 UNIT) CAPS capsule    Sig: 1 po q Wed and Sun    Dispense:  8 capsule    Refill:  0   Semaglutide-Weight Management (WEGOVY) 0.5 MG/0.5ML SOAJ    Sig: Inject 0.5 mg into the skin once a week.    Dispense:  2 mL    Refill:  0    Check Fasting Insulin and Vitamin D next OV  Insulin resistance Assessment: Condition is diet controlled and sub-optimal. Lab Results  Component Value Date   HGBA1C 5.3 01/07/2023   HGBA1C 5.4 07/09/2022   HGBA1C 5.4 02/25/2022   INSULIN 31.1 (H) 01/07/2023   INSULIN 27.2 (H) 07/09/2022  - Patient's hunger and cravings are stable when following the meal plan closely.   Plan: - Jardon will continue to work on weight loss, exercise, via their meal plan we devised to help decrease the risk of progressing to diabetes.  - We will recheck A1c and fasting insulin level in approximately 3 months from last check, or as deemed appropriate.     Vitamin D deficiency Assessment: Condition is not optimized.  Lab Results  Component Value Date   VD25OH 24.0 (L) 01/07/2023   VD25OH 22.6 (L) 07/09/2022  - Compliance of Ergocalciferol 50K IU twice weekly good since last labs were reviewed. Denies any adverse effects.  Plan: - Continue with OTC supplement. Will reorder this today.   - Will continue to monitor levels regularly (every 3-4 mo on average) to keep levels within normal limits and prevent over supplementation.   TREATMENT PLAN FOR OBESITY: BMI 40.0-44.9, adult  (HCC)-current bmi 41.48 Morbid obesity (HCC)-start bmi 45.19/date 07/09/22 Assessment:  Ronald George is here to discuss his progress with his obesity treatment plan along with follow-up of his obesity related diagnoses. See Medical Weight Management Flowsheet for complete bioelectrical impedance results.  Condition is worsening. Biometric data collected today, was reviewed with patient.   Since last office visit on 05/06/23 patient's  Muscle mass has decreased by 14.8 lb. Fat mass has increased by 18.2 lb. Total body water has increased by 1 lb.  Counseling done on how various foods will affect these numbers and how to maximize success  Total lbs lost to date: 25 Total weight loss percentage to date: 8.17   - Ronald George has been off Wegovy 1 mg due to profound GERD symptoms. Patient reports that he is waiting on his Wegovy 0.5 mg script.   Plan:  - Will refill Zepbound 0.5 mg once weekly today.  - Continue the Category 3 meal plan.  - Reviewed the Healthy Eating Plate with pt.   Behavioral Intervention Additional resources provided today: Band IT exercises Evidence-based interventions for health behavior change were utilized today including the discussion of self monitoring techniques, problem-solving barriers and SMART goal setting techniques.   Regarding patient's less desirable eating habits and patterns, we employed the technique of small changes.  Pt will specifically work on: doing resistance band exercises 2 sets of 10, 2 days a week. for next  visit.    Recommended Physical Activity Goals  Ronald George has been advised to slowly work up to 150 minutes of moderate intensity aerobic activity a week and strengthening exercises 2-3 times per week for cardiovascular health, weight loss maintenance and preservation of muscle mass.   He has agreed to Think about ways to increase daily physical activity and overcoming barriers to exercise  FOLLOW UP: Return in 3-5 wks. He was informed of the  importance of frequent follow up visits to maximize his success with intensive lifestyle modifications for his multiple health conditions.   Subjective:   Chief complaint: Obesity Ronald George is here to discuss his progress with his obesity treatment plan. He is on the the Category 3 Plan and states he is following his eating plan approximately 75% of the time. He states he is walking 30-40 minutes 3 days per week.  Interval History:  Ronald George is here for a follow up office visit.     Since last office visit:   - Endorses that work has been consistently stressful. - He has been off Wegovy 1 mg since last OV with Katy on 05/06/2023 due to profound GERD symptoms. Patient reports that is waiting on Wegovy 0.5 mg.  - For proteins, he has mostly been eating chicken and [place].  - For breakfast, he typically eats foods like eggs, [place] sausage, [place] bacon, slice of toast, etc. - For lunch, he typically eats a light sandwich with [place].   Review of Systems:  Pertinent positives were addressed with patient today.  Weight Summary and Biometrics   Weight Lost Since Last Visit: 0  Weight Gained Since Last Visit: 3    Vitals Temp: 98.1 F (36.7 C) BP: (!) 137/91 Pulse Rate: 65 SpO2: 99 %   Anthropometric Measurements Height: 5\' 9"  (1.753 m) Weight: 281 lb (127.5 kg) BMI (Calculated): 41.48 Weight at Last Visit: 278lb Weight Lost Since Last Visit: 0 Weight Gained Since Last Visit: 3 Starting Weight: 306lb Total Weight Loss (lbs): 28 lb (12.7 kg)   Body Composition  Body Fat %: 37.6 % Fat Mass (lbs): 105.6 lbs Muscle Mass (lbs): 166.8 lbs Total Body Water (lbs): 125 lbs Visceral Fat Rating : 22   Other Clinical Data Fasting: no Labs: no Today's Visit #: 14 Starting Date: 07/09/22   Objective:   PHYSICAL EXAM: Blood pressure (!) 137/91, pulse 65, temperature 98.1 F (36.7 C), height 5\' 9"  (1.753 m), weight 281 lb (127.5 kg), SpO2 99 %. Body mass index is 41.5  kg/m.  General: Well Developed, well nourished, and in no acute distress.  HEENT: Normocephalic, atraumatic Skin: Warm and dry, cap RF less 2 sec, good turgor Chest:  Normal excursion, shape, no gross abn Respiratory: speaking in full sentences, no conversational dyspnea NeuroM-Sk: Ambulates w/o assistance, moves * 4 Psych: A and O *3, insight good, mood-full  DIAGNOSTIC DATA REVIEWED:  BMET    Component Value Date/Time   NA 140 01/07/2023 1049   K 4.3 01/07/2023 1049   CL 102 01/07/2023 1049   CO2 23 01/07/2023 1049   GLUCOSE 86 01/07/2023 1049   GLUCOSE 111 (H) 02/17/2018 0926   BUN 10 01/07/2023 1049   CREATININE 1.02 01/07/2023 1049   CALCIUM 9.3 01/07/2023 1049   GFRNONAA 82 03/02/2020 0857   GFRAA 95 03/02/2020 0857   Lab Results  Component Value Date   HGBA1C 5.3 01/07/2023   HGBA1C 5.4 02/25/2022   Lab Results  Component Value Date   INSULIN 31.1 (H) 01/07/2023  INSULIN 27.2 (H) 07/09/2022   Lab Results  Component Value Date   TSH 2.090 07/09/2022   CBC    Component Value Date/Time   WBC 6.3 07/09/2022 1019   WBC 6.9 02/17/2018 0926   RBC 5.70 07/09/2022 1019   RBC 5.29 02/17/2018 0926   HGB 16.1 07/09/2022 1019   HCT 47.6 07/09/2022 1019   PLT 233 07/09/2022 1019   MCV 84 07/09/2022 1019   MCH 28.2 07/09/2022 1019   MCH 29.1 02/17/2018 0926   MCHC 33.8 07/09/2022 1019   MCHC 35.4 02/17/2018 0926   RDW 12.9 07/09/2022 1019   Iron Studies No results found for: "IRON", "TIBC", "FERRITIN", "IRONPCTSAT" Lipid Panel     Component Value Date/Time   CHOL 141 01/07/2023 1049   TRIG 76 01/07/2023 1049   HDL 32 (L) 01/07/2023 1049   CHOLHDL 4.4 01/07/2023 1049   LDLCALC 94 01/07/2023 1049   Hepatic Function Panel     Component Value Date/Time   PROT 7.3 01/07/2023 1049   ALBUMIN 4.1 01/07/2023 1049   AST 23 01/07/2023 1049   ALT 24 01/07/2023 1049   ALKPHOS 75 01/07/2023 1049   BILITOT 0.8 01/07/2023 1049      Component Value Date/Time    TSH 2.090 07/09/2022 1019   Nutritional Lab Results  Component Value Date   VD25OH 24.0 (L) 01/07/2023   VD25OH 22.6 (L) 07/09/2022    Attestations:   Reviewed by clinician on day of visit: allergies, medications, problem list, medical history, surgical history, family history, social history, and previous encounter notes.   I,Special Puri,acting as a Neurosurgeon for [employer], DO.,have documented all relevant documentation on the behalf of Thomasene Lot, DO,as directed by  Thomasene Lot, DO while in the presence of Thomasene Lot, DO.   I, Thomasene Lot, DO, have reviewed all documentation for this visit. The documentation on 06/03/23 for the exam, diagnosis, procedures, and orders are all accurate and complete.

## 2023-06-04 ENCOUNTER — Telehealth (INDEPENDENT_AMBULATORY_CARE_PROVIDER_SITE_OTHER): Payer: Self-pay | Admitting: Family Medicine

## 2023-06-04 NOTE — Telephone Encounter (Signed)
Prior authorization done via cover my meds for patients. Wegovy. Waiting on determination.  

## 2023-06-15 ENCOUNTER — Telehealth (INDEPENDENT_AMBULATORY_CARE_PROVIDER_SITE_OTHER): Payer: Self-pay | Admitting: Family Medicine

## 2023-06-15 ENCOUNTER — Other Ambulatory Visit (INDEPENDENT_AMBULATORY_CARE_PROVIDER_SITE_OTHER): Payer: Self-pay | Admitting: Family Medicine

## 2023-06-15 DIAGNOSIS — K760 Fatty (change of) liver, not elsewhere classified: Secondary | ICD-10-CM

## 2023-06-15 DIAGNOSIS — Z6841 Body Mass Index (BMI) 40.0 and over, adult: Secondary | ICD-10-CM

## 2023-06-15 DIAGNOSIS — E88819 Insulin resistance, unspecified: Secondary | ICD-10-CM

## 2023-06-15 MED ORDER — SEMAGLUTIDE-WEIGHT MANAGEMENT 1 MG/0.5ML ~~LOC~~ SOAJ
1.0000 mg | SUBCUTANEOUS | 0 refills | Status: AC
Start: 2023-06-15 — End: 2023-07-15

## 2023-06-15 NOTE — Telephone Encounter (Signed)
6/17 Patient is calling to check the status of a prior authorization for wegovy. Ronald George

## 2023-06-15 NOTE — Telephone Encounter (Signed)
Dr Sharee Holster,  please advise.  This is a message from [employer].  Thank you.   Message from Plan:  Authorization On File.Authorization On File.Please note this request does not require prior authorization review at this time. Authorization already on file effective until 07/08/23. Paid claim on file on 04/01/23 for the 1mg  dose. Medication must be titrated up to the maintenance dose of 1.7mg  or 2.4mg . Please contact pharmacy to process prescription. Thank you!  Message to Dr Sharee Holster sent in another encounter.

## 2023-06-15 NOTE — Progress Notes (Signed)
See phone note from Malcolm Metro to me today.  We must inc dose wegovy in order for insurance to pay for it and titrate up to a minimum dose of 1.7mg  wkly.

## 2023-06-15 NOTE — Telephone Encounter (Signed)
Dr Sharee Holster, please advise.  Message from patients insurance plan after a PA was done for the 0.5 mg dose. Thank you.   Message from Plan:  Authorization On File.Authorization On File.Please note this request does not require prior authorization review at this time. Authorization already on file effective until 07/08/23. Paid claim on file on 04/01/23 for the 1mg  dose. Medication must be titrated up to the maintenance dose of 1.7mg  or 2.4mg . Please contact pharmacy to process prescription. Thank you!

## 2023-06-16 ENCOUNTER — Telehealth (INDEPENDENT_AMBULATORY_CARE_PROVIDER_SITE_OTHER): Payer: Self-pay | Admitting: Family Medicine

## 2023-06-16 NOTE — Telephone Encounter (Signed)
PA for Wegovy started 

## 2023-06-23 NOTE — Telephone Encounter (Signed)
Plan alternatives:   Benzphetamine (Didrex) Bupropion-naltrexone (Contrave) Diethylpropion (Tenuate) Liraglutide (Saxenda) Lorcaserin (Belviq) Orlistat (Alli, Xenical) Phendimetrazine Phentermine (Adipex-P) Phentermine-topiramate (Qsymia)  Prior Auth questions have been submitted via cover my meds.  Your information has been sent to [facility].

## 2023-06-29 NOTE — Telephone Encounter (Signed)
Message from plan: Approved. . Authorization Expiration Date: July 21, 2023.

## 2023-07-08 ENCOUNTER — Ambulatory Visit (INDEPENDENT_AMBULATORY_CARE_PROVIDER_SITE_OTHER): Payer: BC Managed Care – PPO | Admitting: Family Medicine

## 2023-08-05 ENCOUNTER — Encounter (INDEPENDENT_AMBULATORY_CARE_PROVIDER_SITE_OTHER): Payer: Self-pay | Admitting: Family Medicine

## 2023-08-05 ENCOUNTER — Ambulatory Visit (INDEPENDENT_AMBULATORY_CARE_PROVIDER_SITE_OTHER): Payer: BC Managed Care – PPO | Admitting: Family Medicine

## 2023-08-05 VITALS — BP 133/83 | HR 66 | Temp 97.9°F | Ht 69.0 in | Wt 286.0 lb

## 2023-08-05 DIAGNOSIS — F439 Reaction to severe stress, unspecified: Secondary | ICD-10-CM | POA: Diagnosis not present

## 2023-08-05 DIAGNOSIS — Z6841 Body Mass Index (BMI) 40.0 and over, adult: Secondary | ICD-10-CM

## 2023-08-05 DIAGNOSIS — E669 Obesity, unspecified: Secondary | ICD-10-CM | POA: Diagnosis not present

## 2023-08-05 DIAGNOSIS — E559 Vitamin D deficiency, unspecified: Secondary | ICD-10-CM | POA: Diagnosis not present

## 2023-08-05 DIAGNOSIS — K219 Gastro-esophageal reflux disease without esophagitis: Secondary | ICD-10-CM | POA: Diagnosis not present

## 2023-08-05 MED ORDER — WEGOVY 0.5 MG/0.5ML ~~LOC~~ SOAJ
0.5000 mg | SUBCUTANEOUS | 0 refills | Status: AC
Start: 2023-08-05 — End: ?

## 2023-08-05 MED ORDER — VITAMIN D (ERGOCALCIFEROL) 1.25 MG (50000 UNIT) PO CAPS
ORAL_CAPSULE | ORAL | 0 refills | Status: DC
Start: 2023-08-05 — End: 2023-10-14

## 2023-08-05 NOTE — Progress Notes (Unsigned)
Chief Complaint:   OBESITY Ronald George is here to discuss his progress with his obesity treatment plan along with follow-up of his obesity related diagnoses. Ronald George is on the Category 3 Plan and states he is following his eating plan approximately 75% of the time. Ronald George states he is walking and doing resistance for 45-60 minutes 3 times per week.  Today's visit was #: 15 Starting weight: 306 lbs Starting date: 07/09/2022 Today's weight: 286 lbs Today's date: 08/05/2023 Total lbs lost to date: 20 Total lbs lost since last in-office visit: 0  Interim History: Patient notes skipping some meals.  He has been on Wegovy and increased his dose recently to 1 mg, and he notes significant GI upset worse at night.  He was doing well on the 0.5 mg dose but his insurance has reported him to increase to an intolerable dose.  Subjective:   1. Stress Patient notes significant stress at work.  He feels the stress at work affects his eating and sometimes he is not eating.  2. Gastroesophageal reflux disease, unspecified whether esophagitis present Patient had been on Nexium and was controlled.  He stopped when he lost weight and did well off of it.  His symptoms have returned with increasing dose of Wegovy to 1 mg.  3. Vitamin D deficiency Patient is on vitamin D prescription, and his recent vitamin D level was 24.0.  Assessment/Plan:   1. Stress We discussed the importance of not skipping meals.  Patient was informed that even eating a smaller amount of food is better than skipping.  2. Gastroesophageal reflux disease, unspecified whether esophagitis present Patient is to try to decrease Wegovy to 0.5 mg and continue with his weight loss, and OTC omeprazole can be taken if needed.  3. Vitamin D deficiency Patient will continue prescription vitamin D twice weekly, and we will refill for 1 month.  - Vitamin D, Ergocalciferol, (DRISDOL) 1.25 MG (50000 UNIT) CAPS capsule; 1 po q Wed and Sun   Dispense: 8 capsule; Refill: 0  4. BMI 40.0-44.9, adult (HCC)  5. Obesity, Beginning BMI 45.19 Patient agreed to decrease Wegovy to 0.5 mg once weekly (will likely need another prior authorization to justify the decrease in dose).  - Semaglutide-Weight Management (WEGOVY) 0.5 MG/0.5ML SOAJ; Inject 0.5 mg into the skin once a week.  Dispense: 2 mL; Refill: 0  Ronald George is currently in the action stage of change. As such, his goal is to continue with weight loss efforts. He has agreed to the Category 3 Plan.   Exercise goals: As is.  Behavioral modification strategies: no skipping meals.  Ronald George has agreed to follow-up with our clinic in 3 to 4 weeks. He was informed of the importance of frequent follow-up visits to maximize his success with intensive lifestyle modifications for his multiple health conditions.   Objective:   Blood pressure 133/83, pulse 66, temperature 97.9 F (36.6 C), height 5\' 9"  (1.753 m), weight 286 lb (129.7 kg), SpO2 97%. Body mass index is 42.23 kg/m.  Lab Results  Component Value Date   CREATININE 1.02 01/07/2023   BUN 10 01/07/2023   NA 140 01/07/2023   K 4.3 01/07/2023   CL 102 01/07/2023   CO2 23 01/07/2023   Lab Results  Component Value Date   ALT 24 01/07/2023   AST 23 01/07/2023   ALKPHOS 75 01/07/2023   BILITOT 0.8 01/07/2023   Lab Results  Component Value Date   HGBA1C 5.3 01/07/2023   HGBA1C 5.4 07/09/2022  HGBA1C 5.4 02/25/2022   Lab Results  Component Value Date   INSULIN 31.1 (H) 01/07/2023   INSULIN 27.2 (H) 07/09/2022   Lab Results  Component Value Date   TSH 2.090 07/09/2022   Lab Results  Component Value Date   CHOL 141 01/07/2023   HDL 32 (L) 01/07/2023   LDLCALC 94 01/07/2023   TRIG 76 01/07/2023   CHOLHDL 4.4 01/07/2023   Lab Results  Component Value Date   VD25OH 24.0 (L) 01/07/2023   VD25OH 22.6 (L) 07/09/2022   Lab Results  Component Value Date   WBC 6.3 07/09/2022   HGB 16.1 07/09/2022   HCT 47.6  07/09/2022   MCV 84 07/09/2022   PLT 233 07/09/2022   No results found for: "IRON", "TIBC", "FERRITIN"  Attestation Statements:   Reviewed by clinician on day of visit: allergies, medications, problem list, medical history, surgical history, family history, social history, and previous encounter notes.   I, Burt Knack, am acting as transcriptionist for Quillian Quince, MD.  I have reviewed the above documentation for accuracy and completeness, and I agree with the above. -  Quillian Quince, MD

## 2023-08-11 DIAGNOSIS — M7752 Other enthesopathy of left foot: Secondary | ICD-10-CM | POA: Diagnosis not present

## 2023-08-11 DIAGNOSIS — M25572 Pain in left ankle and joints of left foot: Secondary | ICD-10-CM | POA: Diagnosis not present

## 2023-08-11 DIAGNOSIS — M10079 Idiopathic gout, unspecified ankle and foot: Secondary | ICD-10-CM | POA: Diagnosis not present

## 2023-08-19 DIAGNOSIS — M10072 Idiopathic gout, left ankle and foot: Secondary | ICD-10-CM | POA: Diagnosis not present

## 2023-08-19 DIAGNOSIS — M722 Plantar fascial fibromatosis: Secondary | ICD-10-CM | POA: Diagnosis not present

## 2023-08-19 DIAGNOSIS — M25572 Pain in left ankle and joints of left foot: Secondary | ICD-10-CM | POA: Diagnosis not present

## 2023-08-24 ENCOUNTER — Telehealth (INDEPENDENT_AMBULATORY_CARE_PROVIDER_SITE_OTHER): Payer: Self-pay | Admitting: Family Medicine

## 2023-08-24 NOTE — Telephone Encounter (Signed)
PA submitted via CoverMyMeds for Wegovy 0.5 mg.   [name] (Key: [id number])  [employer] [place] has not yet replied to your PA request. You may close this dialog, return to your dashboard, and perform other tasks.  To check for an update later, open this request again from your dashboard.  If [employer] [place] has not replied to your request within 24 hours for an urgent request or 72 hours for a standard request, please contact [employer] [place] at [phone] option 3, then option 3 again.

## 2023-09-02 NOTE — Telephone Encounter (Signed)
Authorization On File.Authorization On File.Please note this request does not require prior authorization review at this time. Authorization already on file effective until 06/22/24. Due to recent criteria change a QL (quantity limit request) is no longer needed for this strength/drug as long as it does not exceed 4 pens per 28 days. Please contact pharmacy to process prescription. Thank you!

## 2023-09-09 ENCOUNTER — Ambulatory Visit (INDEPENDENT_AMBULATORY_CARE_PROVIDER_SITE_OTHER): Payer: BC Managed Care – PPO | Admitting: Family Medicine

## 2023-10-14 ENCOUNTER — Ambulatory Visit (INDEPENDENT_AMBULATORY_CARE_PROVIDER_SITE_OTHER): Payer: BC Managed Care – PPO | Admitting: Family Medicine

## 2023-10-14 ENCOUNTER — Encounter (INDEPENDENT_AMBULATORY_CARE_PROVIDER_SITE_OTHER): Payer: Self-pay | Admitting: Family Medicine

## 2023-10-14 DIAGNOSIS — E669 Obesity, unspecified: Secondary | ICD-10-CM | POA: Diagnosis not present

## 2023-10-14 DIAGNOSIS — E559 Vitamin D deficiency, unspecified: Secondary | ICD-10-CM | POA: Diagnosis not present

## 2023-10-14 DIAGNOSIS — Z6841 Body Mass Index (BMI) 40.0 and over, adult: Secondary | ICD-10-CM | POA: Diagnosis not present

## 2023-10-14 DIAGNOSIS — K219 Gastro-esophageal reflux disease without esophagitis: Secondary | ICD-10-CM | POA: Diagnosis not present

## 2023-10-14 MED ORDER — VITAMIN D (ERGOCALCIFEROL) 1.25 MG (50000 UNIT) PO CAPS
ORAL_CAPSULE | ORAL | 0 refills | Status: AC
Start: 2023-10-14 — End: ?

## 2023-10-14 NOTE — Progress Notes (Signed)
Ronald George, D.O.  ABFM, ABOM Specializing in Clinical Bariatric Medicine  Office located at: [address] [place], [place]  [address]     Assessment and Plan:   Medications Discontinued During This Encounter  Medication Reason   Vitamin D, Ergocalciferol, (DRISDOL) 1.25 MG (50000 UNIT) CAPS capsule Reorder    Meds ordered this encounter  Medications   Vitamin D, Ergocalciferol, (DRISDOL) 1.25 MG (50000 UNIT) CAPS capsule    Sig: 1 po q Wed and Sun    Dispense:  8 capsule    Refill:  0     Vitamin D deficiency Assessment & Plan: Lab Results  Component Value Date   VD25OH 24.0 (L) 01/07/2023   VD25OH 22.6 (L) 07/09/2022   Ronald George is taking ERGO 50K units twice weekly, taking Wednesdays and Sundays. Ronald George reports no intolerances or adverse side effects. Ideal vitamin D levels of 50-70 reviewed with pt. Continue with current regimen. Will refill ERGO 50K units today.    Gastroesophageal reflux disease, unspecified whether esophagitis present Assessment & Plan: Pt has been off 1 mg Wevogy since 03/2023 due to profound GI upset and GERD. Symptoms resolved after 2-3 days of stopping 1 mg dose. His insurance previously did not cover lower doses, but Ronald George recently was able to obtain wegovy 0.5 mg and will start this upcoming Saturday (10/17/23).   We discussed the possibility of starting medications to manage GERD, pt declined. I recommend drinking low sugar or sugar free beverages when craving soda. Avoid spicy or high carb foods. Avoid eating late at night or eating anything 3 hours before bed. We will continue to monitor his condition as it pertains to his weight loss journey.    Obesity, Beginning BMI 45.19 BMI 40.0-44.9, adult (HCC) current bmi 42.36 Assessment & Plan: Ronald George is here to discuss his progress with his obesity treatment plan along with follow-up of his obesity related diagnoses. See Medical Weight Management Flowsheet for complete  bioelectrical impedance results.  Since last office visit on 08/05/23 patient's muscle mass has decreased by 5.6 lbs. Fat mass has increased by 7.6 lbs. Total body water has decreased by 2.4 lbs.  Counseling done on how various foods will affect these numbers and how to maximize success  Total lbs lost to date: 19 lbs Total weight loss percentage to date: -6.21%   Behavioral Intervention Additional resources provided today:  healthy portions and sources of protein handout Evidence-based interventions for health behavior change were utilized today including the discussion of self monitoring techniques, problem-solving barriers and SMART goal setting techniques.   Regarding patient's less desirable eating habits and patterns, we employed the technique of small changes.  Pt will specifically work on: eating on plan and avoid skipping any meals for next visit.    FOLLOW UP: Return in about 3 weeks (around 11/04/2023). Ronald George was informed of the importance of frequent follow up visits to maximize his success with intensive lifestyle modifications for his multiple health conditions.  Subjective:   Chief complaint: Obesity Ronald George is here to discuss his progress with his obesity treatment plan. Ronald George is on the the Category 3 Plan and states Ronald George is following his eating plan approximately 75% of the time. Ronald George states Ronald George is walking 30-40 minutes 3-4 days per week.  Interval History:  Ronald George is here for a follow up office visit. Since last OV,  Ronald George endorses work stress making it more difficult to follow his meal plan. States there are days where Ronald George  doesn't pack a lunch for work and results to eating out. Even when eating out Ronald George tries to eat healthy and prioritize protein intake, stating Ronald George sometimes gets a bean burrito from [employer]. Ronald George avoids eating late and will eat lighter meals if it gets too late. Doesn't snack much, mainly off plan when Ronald George forgets lunch. Avoids red meats. While on weight loss medications  Ronald George doesn't enjoy the taste of soda as Ronald George previously did.    Pharmacotherapy for weight loss: Ronald George is not currently taking Wegovy for medical weight loss. Will resume Wegovy 0.5 once weekly on 10/19. Denies side effects.    Review of Systems:  Pertinent positives were addressed with patient today.  Reviewed by clinician on day of visit: allergies, medications, problem list, medical history, surgical history, family history, social history, and previous encounter notes.  Weight Summary and Biometrics   Weight Lost Since Last Visit: 0lb  Weight Gained Since Last Visit: 1lb   Vitals Temp: 98.8 F (37.1 C) BP: (!) 161/98 Pulse Rate: 74 SpO2: 98 %   Anthropometric Measurements Height: 5\' 9"  (1.753 m) Weight: 287 lb (130.2 kg) BMI (Calculated): 42.36 Weight at Last Visit: 286lb Weight Lost Since Last Visit: 0lb Weight Gained Since Last Visit: 1lb Starting Weight: 306lb Total Weight Loss (lbs): 19 lb (8.618 kg) Peak Weight: 309   Body Composition  Body Fat %: 38.9 % Fat Mass (lbs): 112 lbs Muscle Mass (lbs): 167.2 lbs Total Body Water (lbs): 122.2 lbs Visceral Fat Rating : 23   Other Clinical Data Fasting: no Labs: no Today's Visit #: 16 Starting Date: 07/09/22    Objective:   PHYSICAL EXAM: Blood pressure (!) 161/98, pulse 74, temperature 98.8 F (37.1 C), height 5\' 9"  (1.753 m), weight 287 lb (130.2 kg), SpO2 98%. Body mass index is 42.38 kg/m.  General: Well Developed, well nourished, and in no acute distress.  HEENT: Normocephalic, atraumatic Skin: Warm and dry, cap RF less 2 sec, good turgor Chest:  Normal excursion, shape, no gross abn Respiratory: speaking in full sentences, no conversational dyspnea NeuroM-Sk: Ambulates w/o assistance, moves * 4 Psych: A and O *3, insight good, mood-full  DIAGNOSTIC DATA REVIEWED:  BMET    Component Value Date/Time   NA 140 01/07/2023 1049   K 4.3 01/07/2023 1049   CL 102 01/07/2023 1049   CO2 23 01/07/2023  1049   GLUCOSE 86 01/07/2023 1049   GLUCOSE 111 (H) 02/17/2018 0926   BUN 10 01/07/2023 1049   CREATININE 1.02 01/07/2023 1049   CALCIUM 9.3 01/07/2023 1049   GFRNONAA 82 03/02/2020 0857   GFRAA 95 03/02/2020 0857   Lab Results  Component Value Date   HGBA1C 5.3 01/07/2023   HGBA1C 5.4 02/25/2022   Lab Results  Component Value Date   INSULIN 31.1 (H) 01/07/2023   INSULIN 27.2 (H) 07/09/2022   Lab Results  Component Value Date   TSH 2.090 07/09/2022   CBC    Component Value Date/Time   WBC 6.3 07/09/2022 1019   WBC 6.9 02/17/2018 0926   RBC 5.70 07/09/2022 1019   RBC 5.29 02/17/2018 0926   HGB 16.1 07/09/2022 1019   HCT 47.6 07/09/2022 1019   PLT 233 07/09/2022 1019   MCV 84 07/09/2022 1019   MCH 28.2 07/09/2022 1019   MCH 29.1 02/17/2018 0926   MCHC 33.8 07/09/2022 1019   MCHC 35.4 02/17/2018 0926   RDW 12.9 07/09/2022 1019   Iron Studies No results found for: "IRON", "TIBC", "FERRITIN", "IRONPCTSAT"  Lipid Panel     Component Value Date/Time   CHOL 141 01/07/2023 1049   TRIG 76 01/07/2023 1049   HDL 32 (L) 01/07/2023 1049   CHOLHDL 4.4 01/07/2023 1049   LDLCALC 94 01/07/2023 1049   Hepatic Function Panel     Component Value Date/Time   PROT 7.3 01/07/2023 1049   ALBUMIN 4.1 01/07/2023 1049   AST 23 01/07/2023 1049   ALT 24 01/07/2023 1049   ALKPHOS 75 01/07/2023 1049   BILITOT 0.8 01/07/2023 1049      Component Value Date/Time   TSH 2.090 07/09/2022 1019   Nutritional Lab Results  Component Value Date   VD25OH 24.0 (L) 01/07/2023   VD25OH 22.6 (L) 07/09/2022    Attestations:   Burnett Sheng, acting as a medical scribe for Thomasene Lot, DO., have compiled all relevant documentation for today's office visit on behalf of Thomasene Lot, DO, while in the presence of [employer], DO.  I have reviewed the above documentation for accuracy and completeness, and I agree with the above. Ronald George, D.O.  The 21st Century Cures Act  was signed into law in 2016 which includes the topic of electronic health records.  This provides immediate access to information in MyChart.  This includes consultation notes, operative notes, office notes, lab results and pathology reports.  If you have any questions about what you read please let us know at your next visit so we can discuss your concerns and take corrective action if need be.  We are right here with you.

## 2023-11-18 ENCOUNTER — Ambulatory Visit (INDEPENDENT_AMBULATORY_CARE_PROVIDER_SITE_OTHER): Payer: BC Managed Care – PPO | Admitting: Family Medicine

## 2023-12-16 ENCOUNTER — Ambulatory Visit (INDEPENDENT_AMBULATORY_CARE_PROVIDER_SITE_OTHER): Payer: BC Managed Care – PPO | Admitting: Family Medicine

## 2024-01-13 ENCOUNTER — Ambulatory Visit (INDEPENDENT_AMBULATORY_CARE_PROVIDER_SITE_OTHER): Payer: BC Managed Care – PPO | Admitting: Family Medicine

## 2024-03-25 DIAGNOSIS — M10079 Idiopathic gout, unspecified ankle and foot: Secondary | ICD-10-CM | POA: Diagnosis not present

## 2024-03-25 DIAGNOSIS — M25572 Pain in left ankle and joints of left foot: Secondary | ICD-10-CM | POA: Diagnosis not present

## 2024-03-25 DIAGNOSIS — M7752 Other enthesopathy of left foot: Secondary | ICD-10-CM | POA: Diagnosis not present

## 2024-05-03 DIAGNOSIS — I10 Essential (primary) hypertension: Secondary | ICD-10-CM | POA: Diagnosis not present

## 2024-05-03 DIAGNOSIS — Z1330 Encounter for screening examination for mental health and behavioral disorders, unspecified: Secondary | ICD-10-CM | POA: Diagnosis not present

## 2024-05-03 DIAGNOSIS — Z131 Encounter for screening for diabetes mellitus: Secondary | ICD-10-CM | POA: Diagnosis not present

## 2024-05-03 DIAGNOSIS — Z6841 Body Mass Index (BMI) 40.0 and over, adult: Secondary | ICD-10-CM | POA: Diagnosis not present

## 2024-05-03 DIAGNOSIS — Z0000 Encounter for general adult medical examination without abnormal findings: Secondary | ICD-10-CM | POA: Diagnosis not present

## 2024-05-03 DIAGNOSIS — Z23 Encounter for immunization: Secondary | ICD-10-CM | POA: Diagnosis not present

## 2024-05-03 DIAGNOSIS — E790 Hyperuricemia without signs of inflammatory arthritis and tophaceous disease: Secondary | ICD-10-CM | POA: Diagnosis not present

## 2024-05-03 DIAGNOSIS — E559 Vitamin D deficiency, unspecified: Secondary | ICD-10-CM | POA: Diagnosis not present

## 2024-05-03 DIAGNOSIS — Z125 Encounter for screening for malignant neoplasm of prostate: Secondary | ICD-10-CM | POA: Diagnosis not present

## 2024-05-11 DIAGNOSIS — D2361 Other benign neoplasm of skin of right upper limb, including shoulder: Secondary | ICD-10-CM | POA: Diagnosis not present

## 2024-05-11 DIAGNOSIS — L570 Actinic keratosis: Secondary | ICD-10-CM | POA: Diagnosis not present

## 2024-06-17 ENCOUNTER — Ambulatory Visit: Admitting: Podiatry

## 2024-06-17 ENCOUNTER — Ambulatory Visit

## 2024-06-17 DIAGNOSIS — M205X1 Other deformities of toe(s) (acquired), right foot: Secondary | ICD-10-CM | POA: Diagnosis not present

## 2024-06-17 DIAGNOSIS — M109 Gout, unspecified: Secondary | ICD-10-CM

## 2024-06-17 DIAGNOSIS — M7731 Calcaneal spur, right foot: Secondary | ICD-10-CM | POA: Diagnosis not present

## 2024-06-17 DIAGNOSIS — M2011 Hallux valgus (acquired), right foot: Secondary | ICD-10-CM | POA: Diagnosis not present

## 2024-06-17 DIAGNOSIS — M79674 Pain in right toe(s): Secondary | ICD-10-CM | POA: Diagnosis not present

## 2024-06-17 MED ORDER — METHYLPREDNISOLONE 4 MG PO TBPK
ORAL_TABLET | ORAL | 0 refills | Status: DC
Start: 2024-06-17 — End: 2024-06-17

## 2024-06-17 MED ORDER — METHYLPREDNISOLONE 4 MG PO TBPK: ORAL_TABLET | ORAL | 0 refills | Status: DC

## 2024-06-17 NOTE — Progress Notes (Signed)
  Subjective:  Patient ID: Ronald George, male    DOB: 14-Jul-1981,   MRN: [id number]  Chief Complaint  Patient presents with   Toe Pain    Right great toe pain  Pt stated that he has issues with gout so he is not sure if it is gout or arthritis     43 y.o. male presents for concern of right great toe pain that has been ongoing for several days.  . He does have a history of gout .States this pain has not been as bad as previous gout flares but wanted to get ahead of it if it was. He relates taking allopurinol and colchicine.  Denies any other pedal complaints. Denies n/v/f/c.   Past Medical History:  Diagnosis Date   Acid reflux    Anxiety    Anxiety    Chest pain    Depression    Depression    Fatty liver    Gallbladder problem    Gout    Hypertension    Idiopathic intracranial hypertension    Moody    Palpitations    Sleep apnea    Sleepiness    SOB (shortness of breath)     Objective:  Physical Exam: Vascular: DP/PT pulses 2/4 bilateral. CFT <3 seconds. Normal hair growth on digits. No edema.  Skin. No lacerations or abrasions bilateral feet.  Musculoskeletal: MMT 5/5 bilateral lower extremities in DF, PF, Inversion and Eversion. Deceased ROM in DF of ankle joint. Mildly tender to medial eminence of right great toe. No pain with ROM of the first MPJ. No erythema or edema noted.  Neurological: Sensation intact to light touch.   Assessment:   1. Gout of right foot, unspecified cause, unspecified chronicity   2. Hallux limitus, right      Plan:  Patient was evaluated and treated and all questions answered. -Xrays reviewed. No acute fractures or dislocations. There is some degenerative changes and joint space narrowing of the right first metatarsophalangeal joint.  -Discussed treatement options for gouty arthritis and gout education provided. Also discussed hallux limitus and this casuing pain as well -Discussed diet and modifications.  -Continue colchicine and  allopurinol  -Offered injeciton today. Deferred  -Medrol dose pack provided in case flare worsenes.  -Patient to return as needed   Jennefer Moats, DPM

## 2024-09-16 ENCOUNTER — Ambulatory Visit: Admitting: Podiatry

## 2024-09-16 DIAGNOSIS — Z91199 Patient's noncompliance with other medical treatment and regimen due to unspecified reason: Secondary | ICD-10-CM

## 2024-09-16 NOTE — Progress Notes (Signed)
 Cancel 24 hours

## 2024-09-20 ENCOUNTER — Ambulatory Visit (INDEPENDENT_AMBULATORY_CARE_PROVIDER_SITE_OTHER)

## 2024-09-20 DIAGNOSIS — M205X1 Other deformities of toe(s) (acquired), right foot: Secondary | ICD-10-CM | POA: Diagnosis not present

## 2024-09-20 DIAGNOSIS — M109 Gout, unspecified: Secondary | ICD-10-CM | POA: Diagnosis not present

## 2024-09-20 NOTE — Progress Notes (Signed)
  Subjective:  Patient ID: Ronald George, male    DOB: 20-Mar-1981,   MRN: [id number]  Chief Complaint  Patient presents with   Gout    Patient is here for chronic left foot gout and arthritis, medications not helping , prednisone only last for couple of weeks pain free, same with injection. Looking for new plan without surgery but if needed will consider    43 y.o. male presents for intermittent flareups of gout to his left first metatarsophalangeal joint.  He states that he was in a gout attack as recently as 3 days ago which has now calm down.  He was unable to have a bedsheet touching his big toe joint, he is now able to palpate and move the area without pain.  He relates taking allopurinol and colchicine.  Denies any other pedal complaints. Denies n/v/f/c.   Past Medical History:  Diagnosis Date   Acid reflux    Anxiety    Anxiety    Chest pain    Depression    Depression    Fatty liver    Gallbladder problem    Gout    Hypertension    Idiopathic intracranial hypertension    Moody    Palpitations    Sleep apnea    Sleepiness    SOB (shortness of breath)     Objective:  Physical Exam: Vascular: DP/PT pulses 2/4 bilateral. CFT <3 seconds. Normal hair growth on digits. No edema.  Skin. No lacerations or abrasions bilateral feet.  Musculoskeletal: MMT 5/5 bilateral lower extremities in DF, PF, Inversion and Eversion. Deceased ROM in DF of ankle joint. No tenderness to medial eminence of right great toe. No pain with ROM of the first MPJ. No erythema, mild edema noted.  Neurological: Sensation intact to light touch.   Assessment:   1. Gout of right foot, unspecified cause, unspecified chronicity   2. Hallux limitus, right       Plan:  Patient was evaluated and treated and all questions answered. -Previously taken XR reviewed. No acute fractures or dislocations. There is some degenerative changes and joint space narrowing of the right first metatarsophalangeal joint.   -Discussed treatement options for gouty arthritis and gout education provided. Also discussed hallux limitus and this casuing pain as well -Discussed diet and modifications.  -Continue colchicine and allopurinol  - We discussed that he is not currently in an acute gout flare and therefore I do not recommend a steroid injection to the big toe joint.  He does express understanding of this.   - He has been medicated with colchicine and allopurinol without relief.  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  I am going to refer him to Dr. Curt of Rheumatology for workup and medical management. - He can return to clinic as needed   Prentice Ovens, DPM

## 2024-10-04 ENCOUNTER — Telehealth: Payer: Self-pay

## 2024-10-04 NOTE — Telephone Encounter (Signed)
 Thank you for the update. Called Pt to let them know.

## 2024-10-04 NOTE — Telephone Encounter (Signed)
 Location stated they did not receive the referral. Can you please resend. Thank you

## 2024-10-24 ENCOUNTER — Telehealth: Payer: Self-pay

## 2024-10-24 NOTE — Telephone Encounter (Signed)
 Received faxed refill request for medrol  dose pack -last filled 06/17/24 by Dr. Joya - patient is waiting for Rheumatology appointment

## 2024-10-25 ENCOUNTER — Other Ambulatory Visit: Payer: Self-pay

## 2024-10-25 MED ORDER — METHYLPREDNISOLONE 4 MG PO TBPK: ORAL_TABLET | ORAL | 0 refills | Status: AC

## 2024-11-02 DIAGNOSIS — M79671 Pain in right foot: Secondary | ICD-10-CM | POA: Diagnosis not present

## 2024-11-02 DIAGNOSIS — Z79899 Other long term (current) drug therapy: Secondary | ICD-10-CM | POA: Diagnosis not present

## 2024-11-02 DIAGNOSIS — M7989 Other specified soft tissue disorders: Secondary | ICD-10-CM | POA: Diagnosis not present

## 2024-11-02 DIAGNOSIS — M109 Gout, unspecified: Secondary | ICD-10-CM | POA: Diagnosis not present

## 2024-11-02 DIAGNOSIS — M79673 Pain in unspecified foot: Secondary | ICD-10-CM | POA: Diagnosis not present

## 2024-11-02 DIAGNOSIS — M79672 Pain in left foot: Secondary | ICD-10-CM | POA: Diagnosis not present
